# Patient Record
Sex: Female | Born: 1965
Health system: Southern US, Community
[De-identification: ages and names within clinical notes are randomized; demographics above are authoritative.]

## PROBLEM LIST (undated history)

## (undated) DIAGNOSIS — R519 Headache, unspecified: Secondary | ICD-10-CM

## (undated) DIAGNOSIS — R51 Headache: Secondary | ICD-10-CM

## (undated) DIAGNOSIS — F419 Anxiety disorder, unspecified: Secondary | ICD-10-CM

## (undated) DIAGNOSIS — F418 Other specified anxiety disorders: Secondary | ICD-10-CM

## (undated) HISTORY — PX: WISDOM TOOTH EXTRACTION: SHX21

## (undated) HISTORY — DX: Headache: R51

## (undated) HISTORY — PX: TUBAL LIGATION: SHX77

## (undated) HISTORY — DX: Anxiety disorder, unspecified: F41.9

## (undated) HISTORY — DX: Headache, unspecified: R51.9

## (undated) HISTORY — DX: Other specified anxiety disorders: F41.8

## (undated) HISTORY — PX: TONSILLECTOMY: SHX5217

---

## 1998-02-19 ENCOUNTER — Other Ambulatory Visit: Admission: RE | Admit: 1998-02-19 | Discharge: 1998-02-19 | Payer: Self-pay | Admitting: Obstetrics and Gynecology

## 1999-06-29 ENCOUNTER — Other Ambulatory Visit: Admission: RE | Admit: 1999-06-29 | Discharge: 1999-06-29 | Payer: Self-pay | Admitting: Obstetrics and Gynecology

## 2000-07-14 ENCOUNTER — Other Ambulatory Visit: Admission: RE | Admit: 2000-07-14 | Discharge: 2000-07-14 | Payer: Self-pay | Admitting: Obstetrics and Gynecology

## 2001-09-19 ENCOUNTER — Other Ambulatory Visit: Admission: RE | Admit: 2001-09-19 | Discharge: 2001-09-19 | Payer: Self-pay | Admitting: Obstetrics and Gynecology

## 2002-12-04 ENCOUNTER — Other Ambulatory Visit: Admission: RE | Admit: 2002-12-04 | Discharge: 2002-12-04 | Payer: Self-pay | Admitting: Obstetrics and Gynecology

## 2003-12-25 ENCOUNTER — Other Ambulatory Visit: Admission: RE | Admit: 2003-12-25 | Discharge: 2003-12-25 | Payer: Self-pay | Admitting: Obstetrics and Gynecology

## 2005-01-12 ENCOUNTER — Emergency Department (HOSPITAL_COMMUNITY): Admission: EM | Admit: 2005-01-12 | Discharge: 2005-01-13 | Payer: Self-pay | Admitting: Emergency Medicine

## 2005-01-14 ENCOUNTER — Ambulatory Visit: Payer: Self-pay | Admitting: Otolaryngology

## 2005-01-19 ENCOUNTER — Other Ambulatory Visit: Admission: RE | Admit: 2005-01-19 | Discharge: 2005-01-19 | Payer: Self-pay | Admitting: Obstetrics and Gynecology

## 2007-06-22 ENCOUNTER — Ambulatory Visit (HOSPITAL_COMMUNITY): Admission: RE | Admit: 2007-06-22 | Discharge: 2007-06-22 | Payer: Self-pay | Admitting: Obstetrics and Gynecology

## 2010-09-13 ENCOUNTER — Encounter: Admission: RE | Admit: 2010-09-13 | Discharge: 2010-09-13 | Payer: Self-pay | Admitting: Obstetrics and Gynecology

## 2010-10-31 ENCOUNTER — Encounter: Payer: Self-pay | Admitting: Obstetrics and Gynecology

## 2011-02-22 ENCOUNTER — Other Ambulatory Visit: Payer: Self-pay | Admitting: Obstetrics and Gynecology

## 2011-02-22 DIAGNOSIS — R1907 Generalized intra-abdominal and pelvic swelling, mass and lump: Secondary | ICD-10-CM

## 2011-02-22 NOTE — Op Note (Signed)
NAME:  Christina [name], GAMMON                 ACCOUNT NO.:  [id number]   MEDICAL RECORD NO.:  [id number]          PATIENT TYPE:  AMB   LOCATION:  [facility]C                           FACILITY:  [facility]   PHYSICIAN:  Juluis Mire, M.D.   DATE OF BIRTH:  07/18/1966   DATE OF PROCEDURE:  06/22/2007  DATE OF DISCHARGE:                               OPERATIVE REPORT   PREOPERATIVE DIAGNOSIS:  Multiparity, desires sterility.   POSTOPERATIVE DIAGNOSIS:  Multiparity, desires sterility.   OPERATIVE PROCEDURE:  Laparoscopic bilateral tubal fulguration.   SURGEON:  Juluis Mire, M.D.   ANESTHESIA:  General.   ESTIMATED BLOOD LOSS:  Minimal.   PACKS AND DRAINS:  None.   INTRAOPERATIVE BLOOD REPLACED:  None.   COMPLICATIONS:  None.   INDICATIONS:  Noted in the history and physical.   PROCEDURE:  The patient was take to the OR and placed in supine  position.  After a satisfactory level of general endotracheal anesthesia  was obtained, the patient was placed in dorsal supine position using the  Allen stirrups.  The abdomen, perineum and vagina were prepped out with  Betadine.  The bladder was emptied by in-and-out catheterization.  A  Hulka tenaculum was put in place and secured.  The patient then draped  in a sterile field.  A subumbilical incision made with a knife.  The  Veress needle was introduced into the abdominal cavity.  The abdomen was  insufflated with approximately 3 L of carbon dioxide.  The operating  laparoscope was introduced.  Visualization revealed no evidence of  injury to adjacent organs.  The upper abdomen including the liver and  tip of the gallbladder were clear.  The appendix was hard to visualize  and may been retrocecal, but there were no abnormalities in the medial  or lateral gutter.  Tubes and ovaries and uterus were unremarkable, no  pelvic pathology.  Using the bipolar, a 3-cm segment tube was coagulated  until the resistance read 0.  Then the same segment tube was then  recoagulated, completely desiccating the tube.  Coagulation did extend  out to the mesosalpinx.  At the end of the procedure both tubes were  adequately coagulated.  The abdomen was deflated of its carbon dioxide.  All trocars were removed.  The subumbilical incision was closed with  interrupted subcuticulars of 4-0 Vicryl.  The Hulka  tenaculum then removed.  The patient taken out of the dorsal lithotomy  position, once alert and extubated transferred to the recovery room in  good condition.  Sponge, instrument and needle count was reported as  correct by the circulating nurse x2.      Juluis Mire, M.D.  Electronically Signed     JSM/MEDQ  D:  06/22/2007  T:  06/22/2007  Job:  [id number]

## 2011-02-22 NOTE — H&P (Signed)
NAME:  Christina [name], Christina [name] NO.:  [id number]   MEDICAL RECORD NO.:  [id number]          PATIENT TYPE:  AMB   LOCATION:  SDC                           [facility]ACILITY:  [facility]   PHYSICIAN:  Juluis Mire, M.D.   DATE OF BIRTH:  21-Dec-1965   DATE OF ADMISSION:  06/22/2007  DATE OF DISCHARGE:                              HISTORY & PHYSICAL   The patient is a 45 year old gravida 2, para 2 female who presents for  laparoscopic bilateral tubal fulguration.  Alternative forms of birth  control have been discussed.  The potential irreversibility of  sterilization was explained.  Failure rate of 1:200 was quoted.  Failures can be in the form of ectopic pregnancy requiring further  surgical management.   IN TERMS OF ALLERGIES, ALLERGIC TO ERYTHROMYCIN.   MEDICATIONS:  Are none.   PAST MEDICAL HISTORY:  Usual childhood diseases.  No significant  sequelae.   OBSTETRICAL HISTORY:  Two vaginal deliveries.   FAMILY HISTORY:  Noncontributory.   SOCIAL HISTORY:  Reveals no tobacco, alcohol use.   REVIEW OF SYSTEMS:  Noncontributory.   PHYSICAL EXAMINATION:  The patient is afebrile with stable vital signs.  HEENT EXAM:  The patient is normocephalic.  Pupils equal and react to  light accommodation.  Extraocular movements intact.  Sclerae and  conjunctivae clear.  Oropharynx clear.  NECK:  Without thyromegaly.  BREASTS:  No discrete masses.  LUNGS:  Clear.  CARDIOVASCULAR SYSTEM:  Regular rhythm and rate without murmurs or  gallops.  Her abdominal exam is benign.  No mass, megaly or tenderness.  On pelvic, normal external genitalia.  Vaginal mucosa is clear.  Cervix  unremarkable.  Uterus normal size, shape and contour.  Adnexa free of  masses or tenderness.  EXTREMITIES:  Trace edema.  NEUROLOGIC EXAM: :  Grossly within normal limits.   IMPRESSION:  Multiparity, desires sterility.   PLAN:  The patient to undergo diagnostic laparoscopy with laparoscopic  bilateral tubal  fulguration.  The risks of surgery have been discussed  including the risk of infection.  Risk of hemorrhage that could require  transfusion with  the risk of AIDS or hepatitis.  The risk of injury to adjacent organs  including bladder, bowel, and ureters that could require further  exploratory surgery.  Risk of deep venous thrombosis and pulmonary  emboli.  The patient expressed understanding of indications and risks.      Juluis Mire, M.D.  Electronically Signed     JSM/MEDQ  D:  06/22/2007  T:  06/22/2007  Job:  [id number]

## 2011-02-22 NOTE — H&P (Signed)
NAME:  Christina [name], DRUM NO.:  [id number]   MEDICAL RECORD NO.:  [id number]          PATIENT TYPE:  AMB   LOCATION:  SDC                           [facility]ILITY:  [facility]   PHYSICIAN:  Juluis Mire, M.D.   DATE OF BIRTH:  08/05/1966   DATE OF ADMISSION:  06/22/2007  DATE OF DISCHARGE:                              HISTORY & PHYSICAL   HISTORY AND PHYSICAL:  Patient is a 45 year old gravida 2, para 2 female  who presents for permanent sterilization.  Alternative forms of birth  control were discussed.  The potential irreversibility of sterilization  is explained.  The failure rate of 1 in 200 to 1 in 300 was discussed.  Failures can be in the form of ectopic pregnancy requiring further  surgical management.  Patient is desirous of permanent sterilization.   In terms of allergies, patient has allergies to erythromycin.   MEDICATIONS:  None.   PAST MEDICAL HISTORY:  Usual childhood diseases.  No significant  sequelae.  No past surgical history.   OBSTETRICAL HISTORY:  She has had two vaginal deliveries.   FAMILY HISTORY:  Noncontributory.   SOCIAL HISTORY:  No tobacco or alcohol use.   REVIEW OF SYSTEMS:  Noncontributory.   PHYSICAL EXAMINATION:  VITAL SIGNS:  Patient is afebrile with stable  vital signs.  HEENT:  Patient normocephalic.  Pupils equal, round, reactive to light  and accommodation.  Extraocular movements are intact.  Sclerae and  conjunctivae are clear.  Oropharynx is clear.  NECK:  Without thyromegaly.  BREASTS:  No discrete masses.  LUNGS:  Clear.  CARDIOVASCULAR:  Regular in rate without murmurs or gallops.  ABDOMEN:  Benign.   DICTATION ENDED AT THIS POINT      Juluis Mire, M.D.  Electronically Signed     JSM/MEDQ  D:  06/22/2007  T:  06/22/2007  Job:  [id number]

## 2011-02-23 ENCOUNTER — Other Ambulatory Visit: Payer: Self-pay

## 2011-02-25 ENCOUNTER — Other Ambulatory Visit: Payer: Self-pay

## 2011-02-25 MED ORDER — GADOBENATE DIMEGLUMINE 529 MG/ML IV SOLN: 10.0000 mL | Freq: Once | INTRAVENOUS | Status: AC | PRN

## 2011-02-26 ENCOUNTER — Ambulatory Visit
Admission: RE | Admit: 2011-02-26 | Discharge: 2011-02-26 | Disposition: A | Discharge status: Home / Self Care | Payer: Medicare HMO | Source: Ambulatory Visit | Attending: Obstetrics and Gynecology | Admitting: Obstetrics and Gynecology

## 2011-02-26 DIAGNOSIS — R1907 Generalized intra-abdominal and pelvic swelling, mass and lump: Secondary | ICD-10-CM

## 2011-02-26 MED ORDER — GADOBENATE DIMEGLUMINE 529 MG/ML IV SOLN
10.0000 mL | Freq: Once | INTRAVENOUS | Status: AC | PRN
  Administered 2011-02-26: 10 mL via INTRAVENOUS

## 2011-07-22 LAB — CBC
Platelets: 262
RDW: 12.5
WBC: 12.5 — ABNORMAL HIGH

## 2012-02-15 ENCOUNTER — Other Ambulatory Visit (HOSPITAL_COMMUNITY): Payer: Self-pay | Admitting: Obstetrics and Gynecology

## 2012-02-15 DIAGNOSIS — D72829 Elevated white blood cell count, unspecified: Secondary | ICD-10-CM

## 2012-02-16 ENCOUNTER — Encounter (HOSPITAL_COMMUNITY): Payer: Self-pay | Admitting: *Deleted

## 2012-02-16 ENCOUNTER — Encounter (HOSPITAL_COMMUNITY): Payer: Self-pay | Admitting: Anesthesiology

## 2012-02-16 ENCOUNTER — Telehealth (INDEPENDENT_AMBULATORY_CARE_PROVIDER_SITE_OTHER): Payer: Self-pay | Admitting: General Surgery

## 2012-02-16 ENCOUNTER — Inpatient Hospital Stay (HOSPITAL_COMMUNITY): Payer: Managed Care, Other (non HMO) | Admitting: Anesthesiology

## 2012-02-16 ENCOUNTER — Observation Stay (HOSPITAL_COMMUNITY)
Admission: EM | Admit: 2012-02-16 | Discharge: 2012-02-17 | Disposition: A | Discharge status: Home / Self Care | Payer: Managed Care, Other (non HMO) | Attending: General Surgery | Admitting: General Surgery

## 2012-02-16 ENCOUNTER — Ambulatory Visit (HOSPITAL_COMMUNITY)
Admission: RE | Admit: 2012-02-16 | Discharge: 2012-02-16 | Disposition: A | Discharge status: Home / Self Care | Payer: Managed Care, Other (non HMO) | Source: Ambulatory Visit | Attending: Obstetrics and Gynecology | Admitting: Obstetrics and Gynecology

## 2012-02-16 ENCOUNTER — Encounter (HOSPITAL_COMMUNITY): Admission: EM | Disposition: A | Discharge status: Home / Self Care | Payer: Self-pay | Source: Home / Self Care

## 2012-02-16 DIAGNOSIS — K3580 Unspecified acute appendicitis: Secondary | ICD-10-CM

## 2012-02-16 DIAGNOSIS — D72829 Elevated white blood cell count, unspecified: Secondary | ICD-10-CM | POA: Insufficient documentation

## 2012-02-16 DIAGNOSIS — Z01812 Encounter for preprocedural laboratory examination: Secondary | ICD-10-CM | POA: Insufficient documentation

## 2012-02-16 DIAGNOSIS — K37 Unspecified appendicitis: Principal | ICD-10-CM | POA: Insufficient documentation

## 2012-02-16 DIAGNOSIS — R1031 Right lower quadrant pain: Secondary | ICD-10-CM

## 2012-02-16 HISTORY — PX: LAPAROSCOPIC APPENDECTOMY: SHX408

## 2012-02-16 LAB — CBC
HCT: 41.4 % (ref 36.0–46.0)
MCHC: 33.8 g/dL (ref 30.0–36.0)
Platelets: 275 10*3/uL (ref 150–400)
RDW: 12.4 % (ref 11.5–15.5)
WBC: 8.5 10*3/uL (ref 4.0–10.5)

## 2012-02-16 LAB — BASIC METABOLIC PANEL
Chloride: 102 mEq/L (ref 96–112)
GFR calc Af Amer: >90 mL/min (ref >90)
GFR calc non Af Amer: >90 mL/min (ref >90)
Potassium: 3.9 mEq/L (ref 3.5–5.1)
Sodium: 136 mEq/L (ref 135–145)

## 2012-02-16 LAB — PREGNANCY, URINE: Preg Test, Ur: NEGATIVE

## 2012-02-16 LAB — URINALYSIS, ROUTINE W REFLEX MICROSCOPIC
Hgb urine dipstick: NEGATIVE
Nitrite: NEGATIVE
Protein, ur: NEGATIVE mg/dL
Urobilinogen, UA: 0.2 mg/dL (ref 0.0–1.0)

## 2012-02-16 LAB — PROTIME-INR
INR: 1.08 (ref 0.00–1.49)
Prothrombin Time: 14.2 seconds (ref 11.6–15.2)

## 2012-02-16 LAB — APTT: aPTT: 37 seconds (ref 24–37)

## 2012-02-16 SURGERY — APPENDECTOMY, LAPAROSCOPIC
Anesthesia: General | Site: Abdomen | Wound class: Clean Contaminated

## 2012-02-16 MED ORDER — IBUPROFEN 200 MG PO TABS: ORAL_TABLET | ORAL | Status: DC

## 2012-02-16 MED ORDER — HYDROCODONE-ACETAMINOPHEN 5-325 MG PO TABS: 1.0000 | ORAL_TABLET | ORAL | Status: DC | PRN

## 2012-02-16 MED ORDER — BUPIVACAINE HCL (PF) 0.5 % IJ SOLN
INTRAMUSCULAR | Status: AC
  Filled 2012-02-16: qty 30

## 2012-02-16 MED ORDER — DEXAMETHASONE SODIUM PHOSPHATE 10 MG/ML IJ SOLN
INTRAMUSCULAR | Status: DC | PRN
  Administered 2012-02-16: 6 mg via INTRAVENOUS

## 2012-02-16 MED ORDER — HYDROCODONE-ACETAMINOPHEN 5-325 MG PO TABS: 1.0000 | ORAL_TABLET | ORAL | Status: AC | PRN

## 2012-02-16 MED ORDER — LACTATED RINGERS IV SOLN: INTRAVENOUS | Status: DC

## 2012-02-16 MED ORDER — KCL IN DEXTROSE-NACL 20-5-0.45 MEQ/L-%-% IV SOLN
INTRAVENOUS | Status: DC
  Filled 2012-02-16: qty 1000

## 2012-02-16 MED ORDER — MIDAZOLAM HCL 5 MG/5ML IJ SOLN
INTRAMUSCULAR | Status: DC | PRN
  Administered 2012-02-16: 2 mg via INTRAVENOUS

## 2012-02-16 MED ORDER — LACTATED RINGERS IV SOLN
INTRAVENOUS | Status: DC | PRN
  Administered 2012-02-16: 14:00:00 via INTRAVENOUS

## 2012-02-16 MED ORDER — ENOXAPARIN SODIUM 40 MG/0.4ML ~~LOC~~ SOLN
40.0000 mg | SUBCUTANEOUS | Status: DC
  Filled 2012-02-16: qty 0.4

## 2012-02-16 MED ORDER — MORPHINE SULFATE 2 MG/ML IJ SOLN: 1.0000 mg | INTRAMUSCULAR | Status: DC | PRN

## 2012-02-16 MED ORDER — LACTATED RINGERS IR SOLN
Status: DC | PRN
  Administered 2012-02-16: 1000 mL

## 2012-02-16 MED ORDER — DIPHENHYDRAMINE HCL 50 MG/ML IJ SOLN: 12.5000 mg | Freq: Four times a day (QID) | INTRAMUSCULAR | Status: DC | PRN

## 2012-02-16 MED ORDER — FENTANYL CITRATE 0.05 MG/ML IJ SOLN
25.0000 ug | INTRAMUSCULAR | Status: DC | PRN
  Administered 2012-02-16: 25 ug via INTRAVENOUS

## 2012-02-16 MED ORDER — HYDROMORPHONE HCL PF 1 MG/ML IJ SOLN: 1.0000 mg | INTRAMUSCULAR | Status: DC | PRN

## 2012-02-16 MED ORDER — PIPERACILLIN-TAZOBACTAM 3.375 G IVPB
3.3750 g | Freq: Three times a day (TID) | INTRAVENOUS | Status: DC
  Administered 2012-02-16 (×2): 3.375 g via INTRAVENOUS
  Filled 2012-02-16 (×2): qty 50

## 2012-02-16 MED ORDER — DIPHENHYDRAMINE HCL 12.5 MG/5ML PO ELIX: 12.5000 mg | ORAL_SOLUTION | Freq: Four times a day (QID) | ORAL | Status: DC | PRN

## 2012-02-16 MED ORDER — SODIUM CHLORIDE 0.9 % IV SOLN
INTRAVENOUS | Status: DC
  Administered 2012-02-16: 12:00:00 via INTRAVENOUS

## 2012-02-16 MED ORDER — ACETAMINOPHEN 325 MG PO TABS: 650.0000 mg | ORAL_TABLET | Freq: Four times a day (QID) | ORAL | Status: AC | PRN

## 2012-02-16 MED ORDER — ONDANSETRON HCL 4 MG/2ML IJ SOLN
INTRAMUSCULAR | Status: DC | PRN
  Administered 2012-02-16: 4 mg via INTRAVENOUS

## 2012-02-16 MED ORDER — BUPIVACAINE HCL (PF) 0.5 % IJ SOLN
INTRAMUSCULAR | Status: DC | PRN
  Administered 2012-02-16: 20 mL

## 2012-02-16 MED ORDER — IOHEXOL 300 MG/ML  SOLN
100.0000 mL | Freq: Once | INTRAMUSCULAR | Status: AC | PRN
  Administered 2012-02-16: 100 mL via INTRAVENOUS

## 2012-02-16 MED ORDER — SODIUM CHLORIDE 0.9 % IV SOLN
INTRAVENOUS | Status: DC
  Administered 2012-02-16: 16:00:00 via INTRAVENOUS
  Administered 2012-02-17: 100 mL via INTRAVENOUS

## 2012-02-16 MED ORDER — FENTANYL CITRATE 0.05 MG/ML IJ SOLN
INTRAMUSCULAR | Status: DC | PRN
  Administered 2012-02-16: 100 ug via INTRAVENOUS
  Administered 2012-02-16: 50 ug via INTRAVENOUS

## 2012-02-16 MED ORDER — SUCCINYLCHOLINE CHLORIDE 20 MG/ML IJ SOLN
INTRAMUSCULAR | Status: DC | PRN
  Administered 2012-02-16: 80 mg via INTRAVENOUS

## 2012-02-16 MED ORDER — HYDROCODONE-ACETAMINOPHEN 5-325 MG PO TABS
1.0000 | ORAL_TABLET | ORAL | Status: DC | PRN
  Administered 2012-02-16 – 2012-02-17 (×2): 1 via ORAL
  Filled 2012-02-16 (×3): qty 1

## 2012-02-16 MED ORDER — FENTANYL CITRATE 0.05 MG/ML IJ SOLN
INTRAMUSCULAR | Status: AC
  Filled 2012-02-16: qty 2

## 2012-02-16 MED ORDER — ONDANSETRON HCL 4 MG/2ML IJ SOLN: 4.0000 mg | Freq: Four times a day (QID) | INTRAMUSCULAR | Status: DC | PRN

## 2012-02-16 MED ORDER — KETOROLAC TROMETHAMINE 30 MG/ML IJ SOLN
INTRAMUSCULAR | Status: DC | PRN
  Administered 2012-02-16: 30 mg via INTRAVENOUS

## 2012-02-16 SURGICAL SUPPLY — 40 items
ADH SKN CLS APL DERMABOND .7 (GAUZE/BANDAGES/DRESSINGS) ×1
APL SKNCLS STERI-STRIP NONHPOA (GAUZE/BANDAGES/DRESSINGS) ×1
APPLIER CLIP 5 13 M/L LIGAMAX5 (MISCELLANEOUS)
APR CLP MED LRG 5 ANG JAW (MISCELLANEOUS)
BAG SPEC RTRVL LRG 6X4 10 (ENDOMECHANICALS) ×1
BANDAGE ADHESIVE 1X3 (GAUZE/BANDAGES/DRESSINGS) ×6 IMPLANT
BENZOIN TINCTURE PRP APPL 2/3 (GAUZE/BANDAGES/DRESSINGS) ×2 IMPLANT
CABLE HIGH FREQUENCY MONO STRZ (ELECTRODE) ×1 IMPLANT
CANISTER SUCTION 2500CC (MISCELLANEOUS) ×2 IMPLANT
CHLORAPREP W/TINT 26ML (MISCELLANEOUS) ×2 IMPLANT
CLIP APPLIE 5 13 M/L LIGAMAX5 (MISCELLANEOUS) IMPLANT
CLOTH BEACON ORANGE TIMEOUT ST (SAFETY) ×2 IMPLANT
CUTTER FLEX LINEAR 45M (STAPLE) ×1 IMPLANT
DECANTER SPIKE VIAL GLASS SM (MISCELLANEOUS) ×2 IMPLANT
DERMABOND ADVANCED (GAUZE/BANDAGES/DRESSINGS) ×1
DERMABOND ADVANCED .7 DNX12 (GAUZE/BANDAGES/DRESSINGS) IMPLANT
DRAPE LAPAROSCOPIC ABDOMINAL (DRAPES) ×2 IMPLANT
ELECT REM PT RETURN 9FT ADLT (ELECTROSURGICAL) ×2
ELECTRODE REM PT RTRN 9FT ADLT (ELECTROSURGICAL) ×1 IMPLANT
GLOVE SURG SIGNA 7.5 PF LTX (GLOVE) ×4 IMPLANT
GOWN STRL NON-REIN LRG LVL3 (GOWN DISPOSABLE) ×2 IMPLANT
GOWN STRL REIN XL XLG (GOWN DISPOSABLE) ×4 IMPLANT
KIT BASIN OR (CUSTOM PROCEDURE TRAY) ×2 IMPLANT
POUCH SPECIMEN RETRIEVAL 10MM (ENDOMECHANICALS) ×1 IMPLANT
RELOAD 45 VASCULAR/THIN (ENDOMECHANICALS) IMPLANT
RELOAD STAPLE 45 2.5 WHT GRN (ENDOMECHANICALS) IMPLANT
RELOAD STAPLE 45 3.5 BLU ETS (ENDOMECHANICALS) IMPLANT
RELOAD STAPLE TA45 3.5 REG BLU (ENDOMECHANICALS) ×2 IMPLANT
SCALPEL HARMONIC ACE (MISCELLANEOUS) ×1 IMPLANT
SET IRRIG TUBING LAPAROSCOPIC (IRRIGATION / IRRIGATOR) ×2 IMPLANT
SOLUTION ANTI FOG 6CC (MISCELLANEOUS) ×2 IMPLANT
STRIP CLOSURE SKIN 1/2X4 (GAUZE/BANDAGES/DRESSINGS) ×2 IMPLANT
SUT MNCRL AB 4-0 PS2 18 (SUTURE) ×2 IMPLANT
TOWEL OR 17X26 10 PK STRL BLUE (TOWEL DISPOSABLE) ×3 IMPLANT
TOWEL OR NON WOVEN STRL DISP B (DISPOSABLE) ×1 IMPLANT
TRAY FOLEY CATH 14FRSI W/METER (CATHETERS) ×2 IMPLANT
TRAY LAP CHOLE (CUSTOM PROCEDURE TRAY) ×2 IMPLANT
TROCAR BLADELESS OPT 5 75 (ENDOMECHANICALS) ×4 IMPLANT
TROCAR XCEL BLUNT TIP 100MML (ENDOMECHANICALS) IMPLANT
TUBING INSUFFLATION 10FT LAP (TUBING) ×2 IMPLANT

## 2012-02-16 NOTE — Anesthesia Preprocedure Evaluation (Signed)
Anesthesia Evaluation  Patient identified by MRN, date of birth, ID band Patient awake    Reviewed: Allergy & Precautions, H&P , NPO status , Patient's Chart, lab work & pertinent test results  Airway Mallampati: II TM Distance: >3 FB Neck ROM: full    Dental No notable dental hx. (+) Teeth Intact and Dental Advisory Given   Pulmonary neg pulmonary ROS,  breath sounds clear to auscultation  Pulmonary exam normal       Cardiovascular Exercise Tolerance: Good negative cardio ROS  Rhythm:regular Rate:Normal     Neuro/Psych negative neurological ROS  negative psych ROS   GI/Hepatic negative GI ROS, Neg liver ROS,   Endo/Other  negative endocrine ROS  Renal/GU negative Renal ROS  negative genitourinary   Musculoskeletal   Abdominal   Peds  Hematology negative hematology ROS (+)   Anesthesia Other Findings   Reproductive/Obstetrics negative OB ROS                           Anesthesia Physical Anesthesia Plan  ASA: I and Emergent  Anesthesia Plan: General   Post-op Pain Management:    Induction: Intravenous  Airway Management Planned: Oral ETT  Additional Equipment:   Intra-op Plan:   Post-operative Plan: Extubation in OR  Informed Consent: I have reviewed the patients History and Physical, chart, labs and discussed the procedure including the risks, benefits and alternatives for the proposed anesthesia with the patient or authorized representative who has indicated his/her understanding and acceptance.   Dental Advisory Given  Plan Discussed with: CRNA and Surgeon  Anesthesia Plan Comments:         Anesthesia Quick Evaluation

## 2012-02-16 NOTE — Transfer of Care (Signed)
Immediate Anesthesia Transfer of Care Note  Patient: Christina [name]  Procedure(s) Performed: Procedure(s) (LRB): APPENDECTOMY LAPAROSCOPIC (N/A)  Patient Location: PACU  Anesthesia Type: General  Level of Consciousness: awake, alert  and oriented  Airway & Oxygen Therapy: Patient Spontanous Breathing and Patient connected to face mask oxygen  Post-op Assessment: Report given to PACU RN and Post -op Vital signs reviewed and stable  Post vital signs: Reviewed and stable  Complications: No apparent anesthesia complications2

## 2012-02-16 NOTE — Anesthesia Postprocedure Evaluation (Signed)
  Anesthesia Post-op Note  Patient: Christina [name]  Procedure(s) Performed: Procedure(s) (LRB): APPENDECTOMY LAPAROSCOPIC (N/A)  Patient Location: PACU  Anesthesia Type: General  Level of Consciousness: awake and alert   Airway and Oxygen Therapy: Patient Spontanous Breathing  Post-op Pain: mild  Post-op Assessment: Post-op Vital signs reviewed, Patient's Cardiovascular Status Stable, Respiratory Function Stable, Patent Airway and No signs of Nausea or vomiting  Post-op Vital Signs: stable  Complications: No apparent anesthesia complications

## 2012-02-16 NOTE — H&P (Signed)
Referring Physician: Keagan Anthis is an 46 y.o. female.  HPI: Third and abdominal pain 2 days ago. She describes as long her abdomen below the umbilicus, and the right lower quadrant. She had a similar episode last year it turned out to be an ovarian cyst. She actually had some syncope with the discomfort from that. She went to Dr. Cletis Media office yesterday and a CT was obtained. This showed a dilated appendix up to 10 mm with slight inflammatory stranding around the appendix. There was also some prominent right lower quadrant mesenteric lymph nodes. This was read as appendicitis and patient was transferred for [facility] to [facility] for evaluation and treatment. Labs and urinalysis have been requested by the ER physician. We've been asked to see the patient.  History reviewed. No pertinent past medical history.    Past Surgical History    Procedure  Date    .  Tubal ligation      No family history on file.  Social History: reports that she has never smoked. She does not have any smokeless tobacco history on file. She reports that she does not drink alcohol or use illicit drugs.  Allergies: No Known Allergies  Medications:  Prior to Admission:  (Not in a hospital admission)  Scheduled:  Continuous:      .  sodium chloride  125 mL/hr at 02/16/12 1132     PRN:  No results found for this or any previous visit (from the past 48 hour(s)).  Ct Abdomen Pelvis W Contrast  02/16/2012 *RADIOLOGY REPORT* Clinical Data: Lower abdominal pain, especially right lower quadrant. Elevated white blood cell count. CT ABDOMEN AND PELVIS WITH CONTRAST Technique: Multidetector CT imaging of the abdomen and pelvis was performed following the standard protocol during bolus administration of intravenous contrast. Contrast: OMNIPAQUE IOHEXOL 300 MG/ML SOLN Comparison: MRI of the pelvis 02/25/2011. Findings: Lung bases are clear. No effusions. Heart is normal size. Liver, gallbladder,  spleen, pancreas, adrenals and left kidney unremarkable. Small cysts in the right kidney. No hydronephrosis. Normal and symmetric enhancement kidneys bilaterally. The appendix is dilated, measuring up to 10 mm. Slight inflammatory stranding around the appendix. Mildly prominent right lower quadrant mesenteric lymph nodes. Findings compatible with early acute appendicitis. Uterus, adnexa and urinary bladder are unremarkable. Large and small bowel grossly unremarkable. No free fluid, free air or adenopathy. No acute bony abnormality. IMPRESSION: Findings compatible with early acute appendicitis. These results were called to Julio Sicks, nurse practitioner at the time of interpretation. Original Report Authenticated By: Cyndie Chime, M.D.   Review of Systems  Constitutional: Positive for fever and chills. Negative for weight loss, malaise/fatigue and diaphoresis.  HENT: Negative.  Eyes: Negative.  Respiratory: Negative.  Cardiovascular: Negative.  Gastrointestinal: Positive for nausea and abdominal pain. Negative for heartburn, vomiting, diarrhea, constipation, blood in stool and melena.  Genitourinary: Negative.  Musculoskeletal: Negative.  Skin: Negative.  Neurological: Negative. Negative for weakness.  Endo/Heme/Allergies: Negative.  Psychiatric/Behavioral: Negative.   Blood pressure 125/71, pulse 70, temperature 98.1 F (36.7 C), temperature source Oral, resp. rate 18, last menstrual period 01/27/2012, SpO2 100.00%.  Physical Exam  Constitutional: She is oriented to person, place, and time. She appears well-developed and well-nourished. No distress.  HENT:  Head: Normocephalic and atraumatic.  Nose: Nose normal.  Mouth/Throat: No oropharyngeal exudate.  Eyes: Conjunctivae and EOM are normal. Pupils are equal, round, and reactive to light. Right eye exhibits no discharge. Left eye exhibits no discharge. No scleral icterus.  Neck: Normal range of motion. Neck supple. No JVD present. No  tracheal deviation present. No thyromegaly present.  Cardiovascular: Normal rate, regular rhythm, normal heart sounds and intact distal pulses. Exam reveals no gallop and no friction rub.  No murmur heard.  Respiratory: Effort normal and breath sounds normal. No stridor. No respiratory distress. She has no wheezes. She has no rales. She exhibits no tenderness.  GI: Soft. Bowel sounds are normal. She exhibits no distension and no mass. There is no tenderness. There is no rebound and no guarding.  Musculoskeletal: She exhibits no edema and no tenderness.  Lymphadenopathy:  She has no cervical adenopathy.  Neurological: She is alert and oriented to person, place, and time. She has normal reflexes. No cranial nerve deficit.  Skin: Skin is warm and dry. No rash noted. She is not diaphoretic. No erythema. No pallor.  Psychiatric: She has a normal mood and affect. Her behavior is normal. Judgment and thought content normal.   Assessment/Plan:  Impression:  1. Acute appendicitis  2. History of laparoscopic bilateral tubal fulgration  Plan: We will admit the patient start her on antibiotic and scheduled for surgery later today. Labs are pending. [facility] physician assistant for Dr. Abigail Miyamoto.  Takhia Spoon  02/16/2012, 11:40 AM

## 2012-02-16 NOTE — Discharge Instructions (Signed)
[facility] ______[facility] LAPAROSCOPIC SURGERY: POST OP INSTRUCTIONS Always review your discharge instruction sheet given to you by the facility where your surgery was performed. IF YOU HAVE DISABILITY OR FAMILY LEAVE FORMS, YOU MUST BRING THEM TO THE OFFICE FOR PROCESSING.   DO NOT GIVE THEM TO YOUR DOCTOR.  1. A prescription for pain medication may be given to you upon discharge.  Take your pain medication as prescribed, if needed.  If narcotic pain medicine is not needed, then you may take acetaminophen (Tylenol) or ibuprofen (Advil) as needed. 2. Take your usually prescribed medications unless otherwise directed. 3. If you need a refill on your pain medication, please contact your pharmacy.  They will contact our office to request authorization. Prescriptions will not be filled after 5pm or on week-ends. 4. You should follow a light diet the first few days after arrival home, such as soup and crackers, etc.  Be sure to include lots of fluids daily. 5. Most patients will experience some swelling and bruising in the area of the incisions.  Ice packs will help.  Swelling and bruising can take several days to resolve.  6. It is common to experience some constipation if taking pain medication after surgery.  Increasing fluid intake and taking a stool softener (such as Colace) will usually help or prevent this problem from occurring.  A mild laxative (Milk of Magnesia or Miralax) should be taken according to package instructions if there are no bowel movements after 48 hours. 7. Unless discharge instructions indicate otherwise, you may remove your bandages 24-48 hours after surgery, and you may shower at that time.  You may have steri-strips (small skin tapes) in place directly over the incision.  These strips should be left on the skin for 7-10 days.  If your surgeon used skin glue on the incision, you may shower in 24 hours.  The glue will flake off over the next 2-3 weeks.  Any sutures or  staples will be removed at the office during your follow-up visit. 8. ACTIVITIES:  You may resume regular (light) daily activities beginning the next day--such as daily self-care, walking, climbing stairs--gradually increasing activities as tolerated.  You may have sexual intercourse when it is comfortable.  Refrain from any heavy lifting or straining until approved by your doctor. a. You may drive when you are no longer taking prescription pain medication, you can comfortably wear a seatbelt, and you can safely maneuver your car and apply brakes. b. RETURN TO WORK:  __________________________________________________________ 9. You should see your doctor in the office for a follow-up appointment approximately 2-3 weeks after your surgery.  Make sure that you call for this appointment within a day or two after you arrive home to insure a convenient appointment time. 10. OTHER INSTRUCTIONS: __________________________________________________________________________________________________________________________ __________________________________________________________________________________________________________________________ WHEN TO CALL YOUR DOCTOR: 1. Fever over 101.0 2. Inability to urinate 3. Continued bleeding from incision. 4. Increased pain, redness, or drainage from the incision. 5. Increasing abdominal pain  The clinic staff is available to answer your questions during regular business hours.  Please don't hesitate to call and ask to speak to one of the nurses for clinical concerns.  If you have a medical emergency, go to the nearest emergency room or call 911.  A surgeon from [facility] is always on call at the hospital. [address], [address], [place], [place]  [address] ? [address] [address], [place], [place]   [address] [phone] ? [phone] ? FAX [phone] Web site:   [email or web address] Laparoscopic Appendectomy Appendectomy is surgery to remove  the appendix. Laparoscopic surgery uses several small cuts (incisions) instead of one large incision. Laparoscopic surgery offers a shorter recovery time and less discomfort. LET YOUR CAREGIVER KNOW ABOUT:  Allergies to food or medicine.   Medicines taken, including vitamins, dietary supplements, herbs, eyedrops, over-the-counter medicines, and creams.   Use of steroids (by mouth or creams).   Previous problems with anesthetics or numbing medicines.   History of bleeding problems or blood clots.   Previous surgery.   Other health problems, including diabetes, heart problems, lung problems, and kidney problems.   Possibility of pregnancy, if this applies.  RISKS AND COMPLICATIONS  Infection. A germ starts growing in the wound. This can usually be treated with antibiotics. In some cases, the wound will need to be opened and cleaned.   Bleeding.   Damage to other organs.   Sores (abscesses).   Chronic pain at the incision sites. This is defined as pain that lasts for more than 3 months.   Blood clots in the legs that may rarely travel to the lungs.   Infection in the lungs (pneumonia).  BEFORE THE PROCEDURE Appendectomy is usually performed immediately after an inflamed appendix (appendicitis) is diagnosed. No preparation is necessary ahead of this procedure. PROCEDURE  You will be given medicine that makes you sleep (general anesthetic). After you are asleep, a flexible tube (catheter) may be inserted into your bladder to drain your urine during surgery. The tube is removed before you wake up after surgery. When you are asleep, carbondioxide gas will be used to inflate your abdomen. This will allow your surgeon to see inside your abdomen and perform your surgery. Three small incisions will be made in your abdomen. Your surgeon will insert a thin, lighted tube (laparoscope) through one of the incisions. Your surgeon will look through the laparoscope while performing the surgery.  Other tools will be inserted through the other incisions. Laparoscopic procedures may not be appropriate when:  There is major scarring from a previous surgery.   The patient has bleeding disorders.   A pregnancy is near term.   There are other conditions which make the laparoscopic procedure impossible, such as an advanced infection or a ruptured appendix.  If your surgeon feels it is not safe to continue with the laparoscopic procedure, he or she will perform an open surgery instead. This gives the surgeon a larger view and more space to work. Open surgery requires a longer recovery time. After your appendix is removed, your incisions will be closed with stitches (sutures) or skin adhesive. AFTER THE PROCEDURE You will be taken to a recovery room. When the anesthesia has worn off, you will be returned to your hospital room. You will be given pain medicines to keep you comfortable. Ask your caregiver how long your hospital stay will be. Document Released: 05/10/2004 Document Revised: 09/15/2011 Document Reviewed: 04/05/2011 Exit[employer] Patient Information 2012 [employer]. 

## 2012-02-16 NOTE — Telephone Encounter (Signed)
ANNETTE WITH DR. Lisbeth Ply OFFICE CALLED TO ASK WHAT TO DO WITH PT SENT TO [facility] FOR [facility] WITH APPENDICITIS. I REVIEWED THIS WITH DR. BLACKMAN AND HE SAID FOR PT TO BE SENT TO [facility], NOTIFY ER AND [facility]. I TALKED WITH ANNETTE AND GAVE HER DR. BLACKMAN'S INSTRUCTIONS. ALSO NOTIFIED [facility]/ STACY) AND [facility]/GY

## 2012-02-16 NOTE — ED Notes (Signed)
Pt was seen at [facility] and saw the NP Okey Regal and did a vaginal [place] that ruled out ovarian cysts. Pt had a high white count and was sent for CT.  Pt had a positive appy on CT and was sent here to see MD Simeon Craft.  Pt reports several days of lower right side pain to midline.  Pt denies V/D.  Pt reports nausea.  Pt had low grade fever yesterday.

## 2012-02-16 NOTE — Progress Notes (Signed)
Patient tolerated clear liquids well. No c/o n & v. Voiding. Ambulated in the hall 360 ft.

## 2012-02-16 NOTE — Anesthesia Procedure Notes (Signed)
Procedure Name: Intubation Date/Time: 02/16/2012 1:54 PM Performed by: Kendal Hymen Pre-anesthesia Checklist: Patient identified, Timeout performed, Emergency Drugs available, Suction available and Patient being monitored Patient Re-evaluated:Patient Re-evaluated prior to inductionOxygen Delivery Method: Circle system utilized Preoxygenation: Pre-oxygenation with 100% oxygen Intubation Type: IV induction Ventilation: Mask ventilation without difficulty Laryngoscope Size: Mac and 3 Grade View: Grade I Tube type: Oral Number of attempts: 1 Airway Equipment and Method: Stylet Placement Confirmation: ETT inserted through vocal cords under direct vision,  positive ETCO2 and breath sounds checked- equal and bilateral Secured at: 21 cm Tube secured with: Tape Dental Injury: Teeth and Oropharynx as per pre-operative assessment

## 2012-02-16 NOTE — Consult Note (Signed)
I have seen and examined the patient and agree with the assessment and plans.  Keoshia Steinmetz A. Jackson Coffield  MD, FACS  

## 2012-02-16 NOTE — H&P (Signed)
I have seen and examined the patient and agree with the assessment and plans.  Plan laparoscopic appendectomy. The risks of the surgery were discussed. These risks include but are not limited to bleeding, infection, appendiceal stump leak, injury to other structures, need to convert to an open procedure, ongoing infection, et Karie Soda. She understands and wishes to proceed.  Shamere Dilworth A. Magnus Ivan  MD, FACS

## 2012-02-16 NOTE — Consult Note (Signed)
Referring Physician: [name] is an 46 y.o. female.  HPI: Third and abdominal pain 2 days ago. She describes as long her abdomen below the umbilicus, and the right lower quadrant. She had a similar episode last year it turned out to be an ovarian cyst. She actually had some syncope with the discomfort from that. She went to Dr. Cletis [name] office yesterday and a CT was obtained. This showed a dilated appendix up to 10 mm with slight inflammatory stranding around the appendix. There was also some prominent right lower quadrant mesenteric lymph nodes. This was read as appendicitis and patient was transferred for Christina [name] to Christina [name] for evaluation and treatment. Labs and urinalysis have been requested by the ER physician. We've been asked to see the patient.  History reviewed. No pertinent past medical history.  Past Surgical History  Procedure Date  . Tubal ligation     No family history on file.  Social History:  reports that she has never smoked. She does not have any smokeless tobacco history on file. She reports that she does not drink alcohol or use illicit drugs.  Allergies: No Known Allergies  Medications:  Prior to Admission:  (Not in a [name] admission) Scheduled:  Continuous:   . sodium chloride 125 mL/hr at 02/16/12 1132   PRN:  No results found for this or any previous visit (from the past 48 hour(s)).  Ct Abdomen Pelvis W Contrast  02/16/2012  *RADIOLOGY REPORT*  Clinical Data: Lower abdominal pain, especially right lower quadrant.  Elevated white blood cell count.  CT ABDOMEN AND PELVIS WITH CONTRAST  Technique:  Multidetector CT imaging of the abdomen and pelvis was performed following the standard protocol during bolus administration of intravenous contrast.  Contrast: OMNIPAQUE IOHEXOL 300 MG/ML  SOLN  Comparison: MRI of the pelvis 02/25/2011.  Findings: Lung bases are clear.  No effusions.  Heart is normal size.  Liver,  gallbladder, spleen, pancreas, adrenals and left kidney unremarkable.  Small cysts in the right kidney.  No hydronephrosis. Normal and symmetric enhancement kidneys bilaterally.  The appendix is dilated, measuring up to 10 mm.  Slight inflammatory stranding around the appendix.  Mildly prominent right lower quadrant mesenteric lymph nodes.  Findings compatible with early acute appendicitis.  Uterus, adnexa and urinary bladder are unremarkable.  Large and small bowel grossly unremarkable.  No free fluid, free air or adenopathy.  No acute bony abnormality.  IMPRESSION: Findings compatible with early acute appendicitis.  These results were called to Christina [name], nurse  practitioner at the time of interpretation.  Original Report Authenticated By: Cyndie Chime, M.D.    Review of Systems  Constitutional: Positive for fever and chills. Negative for weight loss, malaise/fatigue and diaphoresis.  HENT: Negative.   Eyes: Negative.   Respiratory: Negative.   Cardiovascular: Negative.   Gastrointestinal: Positive for nausea and abdominal pain. Negative for heartburn, vomiting, diarrhea, constipation, blood in stool and melena.  Genitourinary: Negative.   Musculoskeletal: Negative.   Skin: Negative.   Neurological: Negative.  Negative for weakness.  Endo/Heme/Allergies: Negative.   Psychiatric/Behavioral: Negative.    Blood pressure 125/71, pulse 70, temperature 98.1 F (36.7 C), temperature source Oral, resp. rate 18, last menstrual period 01/27/2012, SpO2 100.00%. Physical Exam  Constitutional: She is oriented to person, place, and time. She appears well-developed and well-nourished. No distress.  HENT:  Head: Normocephalic and atraumatic.  Nose: Nose normal.  Mouth/Throat: No oropharyngeal exudate.  Eyes: Conjunctivae and EOM are normal.  Pupils are equal, round, and reactive to light. Right eye exhibits no discharge. Left eye exhibits no discharge. No scleral icterus.  Neck: Normal range of  motion. Neck supple. No JVD present. No tracheal deviation present. No thyromegaly present.  Cardiovascular: Normal rate, regular rhythm, normal heart sounds and intact distal pulses.  Exam reveals no gallop and no friction rub.   No murmur heard. Respiratory: Effort normal and breath sounds normal. No stridor. No respiratory distress. She has no wheezes. She has no rales. She exhibits no tenderness.  GI: Soft. Bowel sounds are normal. She exhibits no distension and no mass. There is no tenderness. There is no rebound and no guarding.  Musculoskeletal: She exhibits no edema and no tenderness.  Lymphadenopathy:    She has no cervical adenopathy.  Neurological: She is alert and oriented to person, place, and time. She has normal reflexes. No cranial nerve deficit.  Skin: Skin is warm and dry. No rash noted. She is not diaphoretic. No erythema. No pallor.  Psychiatric: She has a normal mood and affect. Her behavior is normal. Judgment and thought content normal.    Assessment/Plan: Impression:  1. Acute appendicitis 2. History of laparoscopic bilateral tubal fulgration  Plan: We will admit the patient start her on antibiotic and scheduled for surgery later today. Will [place] [name] physician assistant for Dr. Abigail [name].  Christina [name] 02/16/2012, 11:40 AM

## 2012-02-16 NOTE — Op Note (Signed)
APPENDECTOMY LAPAROSCOPIC  Procedure Note  Christina [name] 02/16/2012   Pre-op Diagnosis: appendicitis     Post-op Diagnosis: same  Procedure(s): APPENDECTOMY LAPAROSCOPIC  Surgeon(s): Shelly Rubenstein, MD  Anesthesia: General  Staff:  Jaynee Eagles - Circulator Britt Bottom, CST - Scrub Person Clarnce Flock, CST - Scrub Person  Estimated Blood Loss: Minimal               Specimens: appendix   Findings: The patient was found to have early, acute, nonperforated appendicitis  Procedure: The patient was brought to the operating room and identified as correct patient. She was placed supine on the operating room table and general anesthesia was induced. Her abdomen was then prepped and draped in the usual sterile fashion. I made a small transverse incision through her previous scar just below the umbilicus. I carried this down to the fascia which I then opened with a scalpel. I then used a hemostat to gain entrance into the peritoneal cavity under direct vision. A 0 Vicryl purse sting suture was then placed around the fascial opening. The [facility] port was placed through the opening and insufflation of the abdomen was begun. I then placed another port in the patient's right upper quadrant and another in the patient's left lower quadrant both under direct vision. I was unable to easily identify the cecum and appendix. The appendix was found to be acutely inflamed without evidence of perforation. I took down the mesoappendix with a harmonic scalpel. I then identified the base of the appendix and transected it with the laparoscopic GIA stapler. I then placed the appendix in an Endosac and removed it through the incision at the umbilicus. I then thoroughly irrigated the abdomen with normal saline. Hemostasis appeared to be achieved. All ports were then removed under direct vision and the abdomen was deflated. The 0 Vicryl at the umbilicus was tied in place closing the fascial defect. All  incisions were then anesthetized with Marcaine and closed with 4-0 Monocryl sutures. Dermabond was then applied. The patient tolerated the procedure well. All the counts were correct at the end of the procedure. The patient was then extubated in the operating room and taken in a stable condition to the recovery room.        Ancelmo Hunt A   Date: 02/16/2012  Time: 2:25 PM

## 2012-02-16 NOTE — ED Provider Notes (Signed)
History     CSN: [id number]  Arrival date & time 02/16/12  1046   First MD Initiated Contact with Patient 02/16/12 1052      Chief Complaint  Patient presents with  . Abdominal Pain  . Nausea    HPI Pt was seen at 1100.  Per pt, c/o gradual onset and persistence of constant RLQ pain for the past several days.  Has been assoc with nausea.  Pt was eval by her MD at [facility], was sent to the hospital for a CT scan.  Pt was informed that her CT A/P was +acute appy,and was told to come to the ED to see Surgeon.  Denies vomiting/diarrhea, no CP/SOB.    History reviewed. No pertinent past medical history.  Past Surgical History  Procedure Date  . Tubal ligation     History  Substance Use Topics  . Smoking status: Never Smoker   . Smokeless tobacco: Not on file  . Alcohol Use: No    Review of Systems ROS: Statement: All systems negative except as marked or noted in the HPI; Constitutional: Negative for fever and chills. ; ; Eyes: Negative for eye pain, redness and discharge. ; ; ENMT: Negative for ear pain, hoarseness, nasal congestion, sinus pressure and sore throat. ; ; Cardiovascular: Negative for chest pain, palpitations, diaphoresis, dyspnea and peripheral edema. ; ; Respiratory: Negative for cough, wheezing and stridor. ; ; Gastrointestinal: +nausea, abd pain. Negative for vomiting, diarrhea, blood in stool, hematemesis, jaundice and rectal bleeding. . ; ; Genitourinary: Negative for dysuria, flank pain and hematuria. ; ; Musculoskeletal: Negative for back pain and neck pain. Negative for swelling and trauma.; ; Skin: Negative for pruritus, rash, abrasions, blisters, bruising and skin lesion.; ; Neuro: Negative for headache, lightheadedness and neck stiffness. Negative for weakness, altered level of consciousness , altered mental status, extremity weakness, paresthesias, involuntary movement, seizure and syncope.     Allergies  Review of patient's allergies  indicates no known allergies.  Home Medications   Current Outpatient Rx  Name Route Sig Dispense Refill  . IBUPROFEN 200 MG PO TABS Oral Take 400 mg by mouth every 6 (six) hours as needed.      BP 125/71  Pulse 70  Temp(Src) 98.1 F (36.7 C) (Oral)  Resp 18  SpO2 100%  LMP 01/27/2012  Physical Exam 1100: Physical examination:  Nursing notes reviewed; Vital signs and O2 SAT reviewed;  Constitutional: Well developed, Well nourished, Well hydrated, In no acute distress; Head:  Normocephalic, atraumatic; Eyes: EOMI, PERRL, No scleral icterus; ENMT: Mouth and pharynx normal, Mucous membranes moist; Neck: Supple, Full range of motion; Cardiovascular: Regular rate and rhythm; Respiratory: Breath sounds without audible wheeze, Speaking full sentences, Normal respiratory effort/excursion; Chest: Movement normal; Abdomen: Soft, Normal bowel sounds; Extremities: No deformity, No edema, No calf edema or asymmetry.; Neuro: AA&Ox3, Speech clear, gait steady. Moves all ext well..; Skin: Color normal, Warm, Dry, no rash.    ED Course  Procedures  1105:  T/C to General Surgery, case discussed, including:  HPI, pertinent PM/SHx, VS/PE, dx testing, ED course and treatment:  Agreeable to send PA to ED to eval for admit.     MDM  MDM Reviewed: nursing note and vitals Reviewed previous: CT scan   Ct Abdomen Pelvis W Contrast 02/16/2012  *RADIOLOGY REPORT*  Clinical Data: Lower abdominal pain, especially right lower quadrant.  Elevated white blood cell count.  CT ABDOMEN AND PELVIS WITH CONTRAST  Technique:  Multidetector CT imaging  of the abdomen and pelvis was performed following the standard protocol during bolus administration of intravenous contrast.  Contrast: OMNIPAQUE IOHEXOL 300 MG/ML  SOLN  Comparison: MRI of the pelvis 02/25/2011.  Findings: Lung bases are clear.  No effusions.  Heart is normal size.  Liver, gallbladder, spleen, pancreas, adrenals and left kidney unremarkable.  Small cysts  in the right kidney.  No hydronephrosis. Normal and symmetric enhancement kidneys bilaterally.  The appendix is dilated, measuring up to 10 mm.  Slight inflammatory stranding around the appendix.  Mildly prominent right lower quadrant mesenteric lymph nodes.  Findings compatible with early acute appendicitis.  Uterus, adnexa and urinary bladder are unremarkable.  Large and small bowel grossly unremarkable.  No free fluid, free air or adenopathy.  No acute bony abnormality.  IMPRESSION: Findings compatible with early acute appendicitis.  These results were called to Julio Sicks, nurse  practitioner at the time of interpretation.  Original Report Authenticated By: Cyndie Chime, M.D.           Laray Anger, DO 02/18/12 1134

## 2012-02-17 ENCOUNTER — Encounter (HOSPITAL_COMMUNITY): Payer: Self-pay | Admitting: Surgery

## 2012-02-17 NOTE — Progress Notes (Signed)
1 Day Post-Op  Subjective: Wants to go home.  Eating eggs for breakfast.  Objective: Vital signs in last 24 hours: Temp:  [97 F (36.1 C)-98.5 F (36.9 C)] 98.5 F (36.9 C) (05/10 0534) Pulse Rate:  [70-100] 72  (05/10 0534) Resp:  [12-18] 17  (05/10 0534) BP: (103-129)/(56-82) 107/56 mmHg (05/10 0534) SpO2:  [99 %-100 %] 100 % (05/10 0534) Weight:  [53.524 kg (118 lb)] 53.524 kg (118 lb) (05/09 1530) Last BM Date: 02/16/12  Intake/Output from previous day: 05/09 0701 - 05/10 0700 In: 1785 [P.O.:360; I.V.:1425] Out: 2050 [Urine:2050] Intake/Output this shift: Total I/O In: -  Out: 150 [Urine:150]  General appearance: alert, cooperative and no distress GI: still a bit tender, +BS, no distension.    Lab Results:   Basename 02/16/12 1128  WBC 8.5  HGB 14.0  HCT 41.4  PLT 275    BMET  Basename 02/16/12 1128  NA 136  K 3.9  CL 102  CO2 25  GLUCOSE 84  BUN 13  CREATININE 0.60  CALCIUM 8.7   PT/INR  Basename 02/16/12 1128  LABPROT 14.2  INR 1.08    No results found for this basename: AST:5,ALT:5,ALKPHOS:5,BILITOT:5,PROT:5,ALBUMIN:5 in the last 168 hours   Lipase  No results found for this basename: lipase     Studies/Results: Ct Abdomen Pelvis W Contrast  02/16/2012  *RADIOLOGY REPORT*  Clinical Data: Lower abdominal pain, especially right lower quadrant.  Elevated white blood cell count.  CT ABDOMEN AND PELVIS WITH CONTRAST  Technique:  Multidetector CT imaging of the abdomen and pelvis was performed following the standard protocol during bolus administration of intravenous contrast.  Contrast: OMNIPAQUE IOHEXOL 300 MG/ML  SOLN  Comparison: MRI of the pelvis 02/25/2011.  Findings: Lung bases are clear.  No effusions.  Heart is normal size.  Liver, gallbladder, spleen, pancreas, adrenals and left kidney unremarkable.  Small cysts in the right kidney.  No hydronephrosis. Normal and symmetric enhancement kidneys bilaterally.  The appendix is dilated,  measuring up to 10 mm.  Slight inflammatory stranding around the appendix.  Mildly prominent right lower quadrant mesenteric lymph nodes.  Findings compatible with early acute appendicitis.  Uterus, adnexa and urinary bladder are unremarkable.  Large and small bowel grossly unremarkable.  No free fluid, free air or adenopathy.  No acute bony abnormality.  IMPRESSION: Findings compatible with early acute appendicitis.  These results were called to Julio Sicks, nurse  practitioner at the time of interpretation.  Original Report Authenticated By: Cyndie Chime, M.D.    Medications:    . enoxaparin  40 mg Subcutaneous Q24H  . fentaNYL      . DISCONTD: piperacillin-tazobactam (ZOSYN)  IV  3.375 g Intravenous Q8H    Assessment/Plan 1. Acute appendicitis  2. History of laparoscopic bilateral tubal fulgration    Plan:  D/C Home     LOS: 1 day    Christina [name] 02/17/2012

## 2012-02-17 NOTE — Progress Notes (Signed)
UR complete 

## 2012-02-17 NOTE — Discharge Summary (Signed)
Physician Discharge Summary  Patient ID: Christina [name] MRN: [id number] DOB/AGE: Nov 24, 1965 45 y.o.  Admit date: 02/16/2012 Discharge date: 02/17/2012  Admission Diagnoses: Acute Appendicitis   Discharge Diagnoses: Same Active Problems:  * No active hospital problems. *    PROCEDURES: Laparoscopic Appendectomy 02/16/12  Hospital Course: Pt developed  abdominal pain 2 days ago. She describes as long her abdomen below the umbilicus, and the right lower quadrant. She had a similar episode last year it turned out to be an ovarian cyst. She actually had some syncope with the discomfort from that. She went to Dr. Cletis Media office yesterday and a CT was obtained. This showed a dilated appendix up to 10 mm with slight inflammatory stranding around the appendix. There was also some prominent right lower quadrant mesenteric lymph nodes. This was read as appendicitis and patient was transferred for [facility] for evaluation and treatment. Labs and urinalysis have been requested by the ER physician. We've been asked to see the patient.  Pt was started on Antibiotics, and taken to the OR that afternoon. She did well over night.  Was tolerating full diet and discharged home.  Follow up 2 weeks Dr. Magnus Ivan  Disposition: Final discharge disposition not confirmed   Medication List  As of 02/17/2012  8:30 AM   TAKE these medications         acetaminophen 325 MG tablet   Commonly known as: TYLENOL   Take 2 tablets (650 mg total) by mouth every 6 (six) hours as needed for pain.      HYDROcodone-acetaminophen 5-325 MG per tablet   Commonly known as: NORCO   Take 1-2 tablets by mouth every 4 (four) hours as needed for pain.      ibuprofen 200 MG tablet   Commonly known as: ADVIL,MOTRIN   Take 400 mg by mouth every 6 (six) hours as needed.           Follow-up Information    Follow up with [facility] A, MD. Call in 2 weeks. [phone])    Contact information:   [facility] [address] [place] [address] [phone]          Signed: Sherrie George 02/17/2012, 8:30 AM

## 2012-03-07 ENCOUNTER — Ambulatory Visit (INDEPENDENT_AMBULATORY_CARE_PROVIDER_SITE_OTHER): Payer: Managed Care, Other (non HMO) | Admitting: Surgery

## 2012-03-07 ENCOUNTER — Encounter (INDEPENDENT_AMBULATORY_CARE_PROVIDER_SITE_OTHER): Payer: Self-pay | Admitting: Surgery

## 2012-03-07 VITALS — BP 125/84 | HR 80 | Temp 98.8°F | Ht 62.0 in | Wt 120.4 lb

## 2012-03-07 DIAGNOSIS — Z09 Encounter for follow-up examination after completed treatment for conditions other than malignant neoplasm: Secondary | ICD-10-CM

## 2012-03-07 DIAGNOSIS — Z9049 Acquired absence of other specified parts of digestive tract: Secondary | ICD-10-CM | POA: Insufficient documentation

## 2012-03-07 DIAGNOSIS — Z9889 Other specified postprocedural states: Secondary | ICD-10-CM

## 2012-03-07 NOTE — Progress Notes (Signed)
Subjective:     Patient ID: Christina [name], female   DOB: Jul 24, 1966, 46 y.o.   MRN: [id number]  HPI She is here for her first postop visit status post laparoscopic appendectomy performed on May 9. She is doing well and has no complaints. She has already returned to work  Review of Systems     Objective:   Physical Exam Her incisions are well healed. Her abdomen is soft and nontender. Her final pathology showed acute appendicitis    Assessment:     Patient status post laparoscopic appendectomy    Plan:     She may return to normal activity. I will see her back as needed

## 2012-03-22 ENCOUNTER — Ambulatory Visit (INDEPENDENT_AMBULATORY_CARE_PROVIDER_SITE_OTHER): Payer: Managed Care, Other (non HMO) | Admitting: Surgery

## 2012-03-22 ENCOUNTER — Encounter (INDEPENDENT_AMBULATORY_CARE_PROVIDER_SITE_OTHER): Payer: Self-pay | Admitting: Surgery

## 2012-03-22 VITALS — BP 120/80 | HR 75 | Temp 98.9°F | Resp 12 | Ht 62.5 in | Wt 120.8 lb

## 2012-03-22 DIAGNOSIS — R1033 Periumbilical pain: Secondary | ICD-10-CM

## 2012-03-22 DIAGNOSIS — R109 Unspecified abdominal pain: Secondary | ICD-10-CM

## 2012-03-22 NOTE — Progress Notes (Signed)
Subjective:     Patient ID: Christina [name], female   DOB: 1965/12/14, 46 y.o.   MRN: [id number]  HPI  YANITZA SHVARTSMAN  December 02, 1965 [id number]  Patient Care Team: Juluis Mire, MD as PCP - General (Obstetrics and Gynecology)  This patient is a 46 y.o.female who presents today for surgical evaluation.   Procedure fluoroscopic appendectomy 02/16/2012  Reason for visit: Periumbilical pain.  Patient noted some soreness a few days ago her bellybutton. A little out of a lump there. She was concerned.  No fevers chills or sweats. No nausea or vomiting. Having regular bowel movements. Energy level is good.    Patient Active Problem List  Diagnosis  . S/P appendectomy  . Umbilical pain at incsion, prob muscle strain    History reviewed. No pertinent past medical history.  Past Surgical History  Procedure Date  . Tubal ligation   . Laparoscopic appendectomy 02/16/2012    Procedure: APPENDECTOMY LAPAROSCOPIC;  Surgeon: Shelly Rubenstein, MD;  Location: [facility];  Service: General;  Laterality: N/A;  . Hernia repair     History   Social History  . Marital Status: Married    Spouse Name: N/A    Number of Children: N/A  . Years of Education: N/A   Occupational History  . Not on file.   Social History Main Topics  . Smoking status: Never Smoker   . Smokeless tobacco: Never Used  . Alcohol Use: No  . Drug Use: No  . Sexually Active: No   Other Topics Concern  . Not on file   Social History Narrative  . No narrative on file    History reviewed. No pertinent family history.  Current Outpatient Prescriptions  Medication Sig Dispense Refill  . acetaminophen (TYLENOL) 325 MG tablet Take 2 tablets (650 mg total) by mouth every 6 (six) hours as needed for pain.      [name] ibuprofen (ADVIL,MOTRIN) 200 MG tablet Take 400 mg by mouth every 6 (six) hours as needed.         No Known Allergies  BP 120/80  Pulse 75  Temp 98.9 F (37.2 C) (Temporal)  Resp 12  Ht 5' 2.5" (1.588 m)  Wt  120 lb 12.8 oz (54.795 kg)  BMI 21.74 kg/m2  No results found.   Review of Systems  Constitutional: Negative for fever, chills and diaphoresis.  HENT: Negative for ear pain, sore throat and trouble swallowing.   Eyes: Negative for photophobia and visual disturbance.  Respiratory: Negative for cough and choking.   Cardiovascular: Negative for chest pain and palpitations.  Gastrointestinal: Negative for nausea, vomiting, abdominal pain, diarrhea, constipation, anal bleeding and rectal pain.  Genitourinary: Negative for dysuria, frequency and difficulty urinating.  Musculoskeletal: Negative for myalgias and gait problem.  Skin: Negative for color change, pallor and rash.  Neurological: Negative for dizziness, speech difficulty, weakness and numbness.  Hematological: Negative for adenopathy.  Psychiatric/Behavioral: Negative for confusion and agitation. The patient is not nervous/anxious.        Objective:   Physical Exam  Constitutional: She is oriented to person, place, and time. She appears well-developed and well-nourished. No distress.  HENT:  Head: Normocephalic.  Mouth/Throat: Oropharynx is clear and moist. No oropharyngeal exudate.  Eyes: Conjunctivae and EOM are normal. Pupils are equal, round, and reactive to light. No scleral icterus.  Neck: Normal range of motion. No tracheal deviation present.  Cardiovascular: Normal rate and intact distal pulses.   Pulmonary/Chest: Effort normal. No respiratory distress. She  exhibits no tenderness.  Abdominal: Soft. She exhibits no distension. There is tenderness. Hernia confirmed negative in the right inguinal area and confirmed negative in the left inguinal area.       Incisions clean with normal healing ridges.  No hernias.  Mild periumb soreness  Genitourinary: No vaginal discharge found.  Musculoskeletal: Normal range of motion. She exhibits no tenderness.  Lymphadenopathy:       Right: No inguinal adenopathy present.        Left: No inguinal adenopathy present.  Neurological: She is alert and oriented to person, place, and time. No cranial nerve deficit. She exhibits normal muscle tone. Coordination normal.  Skin: Skin is warm and dry. No rash noted. She is not diaphoretic.  Psychiatric: She has a normal mood and affect. Her behavior is normal.       Assessment:     Umbilical pain, probable strain, no cellulitis/hernia    Plan:     NSAIDS & Heat should help it resolve  Increase activity as tolerated.  Do not push through pain.  Advanced on diet as tolerated. Bowel regimen to avoid problems.  Return to clinic p.r.n. The patient expressed understanding and appreciation

## 2012-03-22 NOTE — Patient Instructions (Addendum)

## 2015-03-17 ENCOUNTER — Other Ambulatory Visit: Payer: Self-pay | Admitting: Obstetrics and Gynecology

## 2015-03-18 LAB — CYTOLOGY - PAP

## 2015-03-19 ENCOUNTER — Encounter: Payer: Self-pay | Admitting: Gastroenterology

## 2015-03-23 ENCOUNTER — Other Ambulatory Visit: Payer: Self-pay | Admitting: Obstetrics and Gynecology

## 2015-03-23 DIAGNOSIS — R928 Other abnormal and inconclusive findings on diagnostic imaging of breast: Secondary | ICD-10-CM

## 2015-03-26 ENCOUNTER — Ambulatory Visit
Admission: RE | Admit: 2015-03-26 | Discharge: 2015-03-26 | Disposition: A | Discharge status: Home / Self Care | Payer: 59 | Source: Ambulatory Visit | Attending: Obstetrics and Gynecology | Admitting: Obstetrics and Gynecology

## 2015-03-26 ENCOUNTER — Other Ambulatory Visit: Payer: Managed Care, Other (non HMO)

## 2015-03-26 DIAGNOSIS — R928 Other abnormal and inconclusive findings on diagnostic imaging of breast: Secondary | ICD-10-CM

## 2015-06-01 ENCOUNTER — Encounter: Payer: Managed Care, Other (non HMO) | Admitting: Gastroenterology

## 2015-10-01 ENCOUNTER — Ambulatory Visit (INDEPENDENT_AMBULATORY_CARE_PROVIDER_SITE_OTHER): Payer: 59 | Admitting: Family Medicine

## 2015-10-01 VITALS — BP 112/72 | HR 85 | Temp 98.5°F | Resp 18 | Ht 63.0 in | Wt 116.0 lb

## 2015-10-01 DIAGNOSIS — R05 Cough: Secondary | ICD-10-CM | POA: Diagnosis not present

## 2015-10-01 DIAGNOSIS — J029 Acute pharyngitis, unspecified: Secondary | ICD-10-CM | POA: Diagnosis not present

## 2015-10-01 DIAGNOSIS — J0100 Acute maxillary sinusitis, unspecified: Secondary | ICD-10-CM | POA: Diagnosis not present

## 2015-10-01 DIAGNOSIS — R059 Cough, unspecified: Secondary | ICD-10-CM

## 2015-10-01 MED ORDER — AZITHROMYCIN 250 MG PO TABS: ORAL_TABLET | ORAL | Status: DC

## 2015-10-01 NOTE — Progress Notes (Signed)
 Chief Complaint:  Chief Complaint  Patient presents with  . chest congestion    x4 , husband sick   . Sore Throat  . sinus pressure    HPI: Christina [name] is a 49 y.o. female who reports to [facility] today complaining of chest congestion, sinus pressure and headaches and otc generic mucinex sinus and laternate the egular mucinex 12 hours, and throat is raw, salt water rinses and dry cough. No fevers orc chills. No rashes, diarrhea, n/v and with minimal abd upset and is expecting her cycle. No wheezing or CP , feels tight in the chest but not actively wheezing.  Was on plane when this occurred. Her husband has been sick as well.  Her daughter just graduated from [employer] [place] in [place]  History reviewed. No pertinent past medical history. Past Surgical History  Procedure Laterality Date  . Tubal ligation    . Laparoscopic appendectomy  02/16/2012    Procedure: APPENDECTOMY LAPAROSCOPIC;  Surgeon: Harl Bowie, MD;  Location: [facility];  Service: General;  Laterality: N/A;  . Hernia repair    . Appendectomy     Social History   Social History  . Marital Status: Married    Spouse Name: N/A  . Number of Children: N/A  . Years of Education: N/A   Social History Main Topics  . Smoking status: Never Smoker   . Smokeless tobacco: Never Used  . Alcohol Use: No  . Drug Use: No  . Sexual Activity: No   Other Topics Concern  . None   Social History Narrative   Family History  Problem Relation Age of Onset  . Heart disease Mother   . Stroke Mother   . Cancer Father    No Known Allergies Prior to Admission medications   Medication Sig Start Date End Date Taking? Authorizing Provider  guaiFENesin (MUCINEX) 600 MG 12 hr tablet Take by mouth 2 (two) times daily.   Yes Historical Provider, MD  ibuprofen (ADVIL,MOTRIN) 200 MG tablet Take 400 mg by mouth every 6 (six) hours as needed. Reported on 10/01/2015    Historical Provider, MD     ROS: The patient denies  fevers, chills, night sweats, unintentional weight loss, chest pain, palpitations, wheezing, dyspnea on exertion, nausea, vomiting, abdominal pain, dysuria, hematuria, melena, numbness, weakness, or tingling.   All other systems have been reviewed and were otherwise negative with the exception of those mentioned in the HPI and as above.    PHYSICAL EXAM: Filed Vitals:   10/01/15 0837  BP: 112/72  Pulse: 85  Temp: 98.5 F (36.9 C)  Resp: 18   Body mass index is 20.55 kg/(m^2).   General: Alert, no acute distress HEENT:  Normocephalic, atraumatic, oropharynx patent. EOMI, PERRLA Erythematous throat, no exudates, TM normal, + sinus tenderness, + erythematous/boggy nasal mucosa Cardiovascular:  Regular rate and rhythm, no rubs murmurs or gallops.  Respiratory: Clear to auscultation bilaterally.  No wheezes, rales, or rhonchi.  No cyanosis, no use of accessory musculature Abdominal: No organomegaly, abdomen is soft and non-tender, positive bowel sounds. No masses. Skin: No rashes. Neurologic: Facial musculature symmetric. Psychiatric: Patient acts appropriately throughout our interaction. Lymphatic: No cervical or submandibular lymphadenopathy Musculoskeletal: Gait intact. No edema, tenderness   LABS: Results for orders placed or performed in visit on 03/17/15  Cytology - PAP  Result Value Ref Range   CYTOLOGY - PAP PAP RESULT      EKG/XRAY:   Primary read interpreted by  Dr. Marin Comment at [facility]   ASSESSMENT/PLAN: Encounter Diagnoses  Name Primary?  . Acute pharyngitis, unspecified pharyngitis type   . Acute maxillary sinusitis, recurrence not specified Yes  . Cough    Rx azithromycin prn but try sxs treatment first Nasocort and cont with mucinex Fu prn   Gross sideeffects, risk and benefits, and alternatives of medications d/w patient. Patient is aware that all medications have potential sideeffects and we are unable to predict every sideeffect or drug-drug interaction that may  occur.    DO  10/01/2015 9:01 AM

## 2015-10-01 NOTE — Patient Instructions (Signed)

## 2016-03-02 ENCOUNTER — Encounter: Payer: Self-pay | Admitting: Gastroenterology

## 2016-04-11 DIAGNOSIS — Z1231 Encounter for screening mammogram for malignant neoplasm of breast: Secondary | ICD-10-CM | POA: Diagnosis not present

## 2016-04-11 DIAGNOSIS — Z01419 Encounter for gynecological examination (general) (routine) without abnormal findings: Secondary | ICD-10-CM | POA: Diagnosis not present

## 2016-04-11 DIAGNOSIS — Z6821 Body mass index (BMI) 21.0-21.9, adult: Secondary | ICD-10-CM | POA: Diagnosis not present

## 2016-04-15 ENCOUNTER — Ambulatory Visit (AMBULATORY_SURGERY_CENTER): Payer: Self-pay | Admitting: *Deleted

## 2016-04-15 VITALS — Ht 62.0 in | Wt 120.8 lb

## 2016-04-15 DIAGNOSIS — Z1211 Encounter for screening for malignant neoplasm of colon: Secondary | ICD-10-CM

## 2016-04-15 MED ORDER — SUPREP BOWEL PREP KIT 17.5-3.13-1.6 GM/177ML PO SOLN: 1.0000 | Freq: Once | ORAL | Status: DC

## 2016-04-15 MED FILL — SUPREP BOWEL PREP KIT: 17.5-3.13-1 | 2 days supply | Qty: 354 | Fill #0

## 2016-04-15 NOTE — Progress Notes (Signed)
Patient denies any allergies to egg or soy products. Patient denies complications with anesthesia/sedation.  Patient denies oxygen use at home and denies diet medications. Emmi instructions for colonoscopy explained but patient denied.  Pamphlet given.  

## 2016-05-02 ENCOUNTER — Encounter: Payer: Self-pay | Admitting: Gastroenterology

## 2016-05-02 ENCOUNTER — Ambulatory Visit (AMBULATORY_SURGERY_CENTER): Payer: 59 | Admitting: Gastroenterology

## 2016-05-02 VITALS — BP 143/88 | HR 79 | Temp 98.2°F | Resp 16 | Ht 62.0 in | Wt 120.0 lb

## 2016-05-02 DIAGNOSIS — Z1211 Encounter for screening for malignant neoplasm of colon: Secondary | ICD-10-CM | POA: Diagnosis present

## 2016-05-02 NOTE — Patient Instructions (Signed)
Discharge instructions given. Handouts on diverticulosis and hemorrhoids. Resume previous medications. YOU HAD AN ENDOSCOPIC PROCEDURE TODAY AT THE [facility]:   Refer to the procedure report that was given to you for any specific questions about what was found during the examination.  If the procedure report does not answer your questions, please call your gastroenterologist to clarify.  If you requested that your care partner not be given the details of your procedure findings, then the procedure report has been included in a sealed envelope for you to review at your convenience later.  YOU SHOULD EXPECT: Some feelings of bloating in the abdomen. Passage of more gas than usual.  Walking can help get rid of the air that was put into your GI tract during the procedure and reduce the bloating. If you had a lower endoscopy (such as a colonoscopy or flexible sigmoidoscopy) you may notice spotting of blood in your stool or on the toilet paper. If you underwent a bowel prep for your procedure, you may not have a normal bowel movement for a few days.  Please Note:  You might notice some irritation and congestion in your nose or some drainage.  This is from the oxygen used during your procedure.  There is no need for concern and it should clear up in a day or so.  SYMPTOMS TO REPORT IMMEDIATELY:   Following lower endoscopy (colonoscopy or flexible sigmoidoscopy):  Excessive amounts of blood in the stool  Significant tenderness or worsening of abdominal pains  Swelling of the abdomen that is new, acute  Fever of 100F or higher   For urgent or emergent issues, a gastroenterologist can be reached at any hour by calling [phone].   DIET: Your first meal following the procedure should be a small meal and then it is ok to progress to your normal diet. Heavy or fried foods are harder to digest and may make you feel nauseous or bloated.  Likewise, meals heavy in dairy and vegetables can  increase bloating.  Drink plenty of fluids but you should avoid alcoholic beverages for 24 hours.  ACTIVITY:  You should plan to take it easy for the rest of today and you should NOT DRIVE or use heavy machinery until tomorrow (because of the sedation medicines used during the test).    FOLLOW UP: Our staff will call the number listed on your records the next business day following your procedure to check on you and address any questions or concerns that you may have regarding the information given to you following your procedure. If we do not reach you, we will leave a message.  However, if you are feeling well and you are not experiencing any problems, there is no need to return our call.  We will assume that you have returned to your regular daily activities without incident.  If any biopsies were taken you will be contacted by phone or by letter within the next 1-3 weeks.  Please call us at [phone] if you have not heard about the biopsies in 3 weeks.    SIGNATURES/CONFIDENTIALITY: You and/or your care partner have signed paperwork which will be entered into your electronic medical record.  These signatures attest to the fact that that the information above on your After Visit Summary has been reviewed and is understood.  Full responsibility of the confidentiality of this discharge information lies with you and/or your care-partner. 

## 2016-05-02 NOTE — Progress Notes (Signed)
Report to PACU, RN, vss, BBS= Clear.  

## 2016-05-02 NOTE — Op Note (Addendum)
[facility] Patient Name: Christina [name] Procedure Date: 05/02/2016 11:27 AM MRN: [id number] Endoscopist: Ladene Artist , MD Age: 50 Referring MD:  Date of Birth: 24-Sep-1966 Gender: Female Account #: [id number] Procedure:                Colonoscopy Indications:              Screening for colorectal malignant neoplasm Medicines:                Monitored Anesthesia Care Procedure:                Pre-Anesthesia Assessment:                           - Prior to the procedure, a History and Physical                            was performed, and patient medications and                            allergies were reviewed. The patient's tolerance of                            previous anesthesia was also reviewed. The risks                            and benefits of the procedure and the sedation                            options and risks were discussed with the patient.                            All questions were answered, and informed consent                            was obtained. Prior Anticoagulants: The patient has                            taken no previous anticoagulant or antiplatelet                            agents. ASA Grade Assessment: I - A normal, healthy                            patient. After reviewing the risks and benefits,                            the patient was deemed in satisfactory condition to                            undergo the procedure.                           After obtaining informed consent, the colonoscope  was passed under direct vision. Throughout the                            procedure, the patient's blood pressure, pulse, and                            oxygen saturations were monitored continuously. The                            Model PCF-[id number]) scope was introduced                            through the anus and advanced to the the cecum,                            identified by appendiceal  orifice and ileocecal                            valve. The ileocecal valve, appendiceal orifice,                            and rectum were photographed. The quality of the                            bowel preparation was excellent. The patient                            tolerated the procedure well. The colonoscopy was                            somewhat difficult due to a tortuous colon.                            Successful completion of the procedure was aided by                            using manual pressure, withdrawing and reinserting                            the scope and straightening and shortening the                            scope to obtain bowel loop reduction. Scope In: 11:31:51 AM Scope Out: 11:48:50 AM Scope Withdrawal Time: 0 hours 9 minutes 27 seconds  Total Procedure Duration: 0 hours 16 minutes 59 seconds  Findings:                 Internal hemorrhoids were found during                            retroflexion. The hemorrhoids were small and Grade                            I (internal hemorrhoids that do not prolapse).  A few small-mouthed diverticula were found in the                            sigmoid colon.                           The exam was otherwise without abnormality on                            direct and retroflexion views. Complications:            No immediate complications. Estimated blood loss:                            None. Estimated Blood Loss:     Estimated blood loss: none. Impression:               - Internal hemorrhoids.                           - Diverticulosis in the sigmoid colon.                           - The examination was otherwise normal on direct                            and retroflexion views.                           - No specimens collected. Recommendation:           - Repeat colonoscopy in 10 years for screening                            purposes.                           - Patient has a  contact number available for                            emergencies. The signs and symptoms of potential                            delayed complications were discussed with the                            patient. Return to normal activities tomorrow.                            Written discharge instructions were provided to the                            patient.                           - Continue present medications.                           -  High fiber diet. Ladene Artist, MD 05/02/2016 11:52:14 AM This report has been signed electronically.

## 2016-05-03 ENCOUNTER — Telehealth: Payer: Self-pay

## 2016-05-03 NOTE — Telephone Encounter (Signed)
  Follow up Call-  Call back number 05/02/2016  Post procedure Call Back phone  # [phone]  Permission to leave phone message Yes  Some recent data might be hidden     Patient questions:  Do you have a fever, pain , or abdominal swelling? No. Pain Score  0 *  Have you tolerated food without any problems? Yes.    Have you been able to return to your normal activities? Yes.    Do you have any questions about your discharge instructions: Diet   No. Medications  No. Follow up visit  No.  Do you have questions or concerns about your Care? No.  Actions: * If pain score is 4 or above: No action needed, pain <4.

## 2016-05-21 ENCOUNTER — Ambulatory Visit (INDEPENDENT_AMBULATORY_CARE_PROVIDER_SITE_OTHER): Payer: 59 | Admitting: Physician Assistant

## 2016-05-21 VITALS — BP 118/68 | HR 88 | Temp 98.6°F | Resp 17 | Ht 62.5 in | Wt 118.0 lb

## 2016-05-21 DIAGNOSIS — R05 Cough: Secondary | ICD-10-CM | POA: Diagnosis not present

## 2016-05-21 DIAGNOSIS — J029 Acute pharyngitis, unspecified: Secondary | ICD-10-CM | POA: Diagnosis not present

## 2016-05-21 DIAGNOSIS — R0981 Nasal congestion: Secondary | ICD-10-CM | POA: Diagnosis not present

## 2016-05-21 DIAGNOSIS — R059 Cough, unspecified: Secondary | ICD-10-CM

## 2016-05-21 MED ORDER — BENZONATATE 100 MG PO CAPS: 100.0000 mg | ORAL_CAPSULE | Freq: Three times a day (TID) | ORAL | 0 refills | Status: DC | PRN

## 2016-05-21 NOTE — Patient Instructions (Addendum)
OTC flonase for left nostril OTC throat lozenge and ibuprofen for sore throat Continue delsym and mucinex  Drink hot tea and honey, get plenty of rest of keep drinking fluids Contact me if symptoms worsen      IF you received an x-ray today, you will receive an invoice from [facility]. Please contact [facility] at [phone] with questions or concerns regarding your invoice.   IF you received labwork today, you will receive an invoice from [employer]. Please contact [facility] at [phone] with questions or concerns regarding your invoice.   Our billing staff will not be able to assist you with questions regarding bills from these companies.  You will be contacted with the lab results as soon as they are available. The fastest way to get your results is to activate your My Chart account. Instructions are located on the last page of this paperwork. If you have not heard from [place] regarding the results in 2 weeks, please contact this office.

## 2016-05-21 NOTE — Progress Notes (Signed)
Christina [name]  MRN: [id number] DOB: 1965/11/11  Subjective:  Christina [name] is a 50 y.o. female seen in office today for a chief complaint of illness x 3 days.   Has associated head/nasal/chest congestion, sore throat, nonproductive cough, ear pressure, myalgias,chilles, diaphoresis, and subjective fever (99.0).Denies chest pain or SOB.  Pt's husband has recently been sick with similar symptoms. Was seen at an urgent care and treated with a zpack.  Pt has tried advil, ibuprofen, and mucinex with mild relief.   Of note, pt's symptoms are getting better, pt's main complaint is uncomfortable throat from coughing episodes.   Review of Systems  Gastrointestinal: Negative for abdominal pain, diarrhea, nausea and vomiting.  Neurological: Negative for dizziness and light-headedness.    Patient Active Problem List   Diagnosis Date Noted  . Umbilical pain at incsion, prob muscle strain 03/22/2012  . S/P appendectomy 03/07/2012    Current Outpatient Prescriptions on File Prior to Visit  Medication Sig Dispense Refill  . ibuprofen (ADVIL,MOTRIN) 200 MG tablet Take 400 mg by mouth every 6 (six) hours as needed. Reported on 10/01/2015    . Lysine 500 MG TABS Take 500 mg by mouth daily.     No current facility-administered medications on file prior to visit.     No Known Allergies  Objective:  BP 118/68 (BP Location: Right Arm, Patient Position: Sitting, Cuff Size: Normal)   Pulse 88   Temp 98.6 F (37 C) (Oral)   Resp 17   Ht 5' 2.5" (1.588 m)   Wt 118 lb (53.5 kg)   LMP 05/10/2016 (Approximate)   SpO2 98%   BMI 21.24 kg/m   Physical Exam  Constitutional: She is oriented to person, place, and time and well-developed, well-nourished, and in no distress.  HENT:  Head: Normocephalic and atraumatic.  Right Ear: Tympanic membrane, external ear and ear canal normal.  Left Ear: Tympanic membrane, external ear and ear canal normal.  Nose: Mucosal edema (in left nostril ) present.  Right sinus exhibits no maxillary sinus tenderness and no frontal sinus tenderness. Left sinus exhibits no maxillary sinus tenderness and no frontal sinus tenderness.  Mouth/Throat: Uvula is midline and mucous membranes are normal. Posterior oropharyngeal erythema present.  Eyes: Conjunctivae are normal.  Neck: Normal range of motion.  Pulmonary/Chest: Effort normal and breath sounds normal. She has no wheezes. She has no rales.  Lymphadenopathy:       Head (right side): No submental, no submandibular, no tonsillar, no preauricular, no posterior auricular and no occipital adenopathy present.       Head (left side): No submental, no submandibular, no tonsillar, no preauricular, no posterior auricular and no occipital adenopathy present.    She has no cervical adenopathy.       Right: No supraclavicular adenopathy present.       Left: No supraclavicular adenopathy present.  Neurological: She is alert and oriented to person, place, and time. Gait normal.  Skin: Skin is warm and dry.  Psychiatric: Affect normal.  Vitals reviewed.   Assessment and Plan :  1. Cough - benzonatate (TESSALON) 100 MG capsule; Take 1-2 capsules (100-200 mg total) by mouth 3 (three) times daily as needed for cough.  Dispense: 40 capsule; Refill: 0 2. Nasal congestion 3. Sore Throat -Likely viral, will treat supportively  -OTC flonase, throat lozenge, delsym, mucinex, and ibuprofen prn for symptoms -Hot tea with honey -If no improvement or develop worsening of symptoms in 7 days, pt will mychart me for discussion  of antibiotic prescription.    Tenna Delaine PA-C  [facility] 05/21/2016 8:19 AM

## 2016-05-22 ENCOUNTER — Telehealth: Payer: 59 | Admitting: Nurse Practitioner

## 2016-05-22 DIAGNOSIS — R05 Cough: Secondary | ICD-10-CM

## 2016-05-22 DIAGNOSIS — R059 Cough, unspecified: Secondary | ICD-10-CM

## 2016-05-22 MED ORDER — BENZONATATE 100 MG PO CAPS: 100.0000 mg | ORAL_CAPSULE | Freq: Three times a day (TID) | ORAL | 0 refills | Status: DC | PRN

## 2016-05-22 MED ORDER — AZITHROMYCIN 250 MG PO TABS: ORAL_TABLET | ORAL | 0 refills | Status: DC

## 2016-05-22 NOTE — Progress Notes (Signed)

## 2016-07-16 ENCOUNTER — Ambulatory Visit (INDEPENDENT_AMBULATORY_CARE_PROVIDER_SITE_OTHER): Payer: 59

## 2016-07-16 ENCOUNTER — Ambulatory Visit (INDEPENDENT_AMBULATORY_CARE_PROVIDER_SITE_OTHER): Payer: 59 | Admitting: Family Medicine

## 2016-07-16 VITALS — BP 126/72 | HR 74 | Temp 98.1°F | Resp 16 | Ht 62.5 in | Wt 122.0 lb

## 2016-07-16 DIAGNOSIS — S99912A Unspecified injury of left ankle, initial encounter: Secondary | ICD-10-CM

## 2016-07-16 NOTE — Progress Notes (Signed)
Chief Complaint  Patient presents with  . Ankle Injury    Left ankle yesterday    HPI Left ankle injury Pt reports was stepping off the curb 24 hours ago when she rolled her ankle She has been icing, elevating and taking ibuprofen She reports that her pain is 6/10 She can wiggle her toes She is able to walk by putting her weight on the heel but cannot weight bear without pain  She has not had previous trauma to the ankle  No past medical history on file.  Current Outpatient Prescriptions  Medication Sig Dispense Refill  . ibuprofen (ADVIL,MOTRIN) 200 MG tablet Take 400 mg by mouth every 6 (six) hours as needed. Reported on 10/01/2015    . Lysine 500 MG TABS Take 500 mg by mouth daily.    . Multiple Vitamins-Minerals (MULTIVITAMIN WITH MINERALS) tablet Take 1 tablet by mouth daily.     No current facility-administered medications for this visit.    No Known Allergies   Past Surgical History:  Procedure Laterality Date  . APPENDECTOMY    . HERNIA REPAIR    . LAPAROSCOPIC APPENDECTOMY  02/16/2012   Procedure: APPENDECTOMY LAPAROSCOPIC;  Surgeon: Harl Bowie, MD;  Location: [facility];  Service: General;  Laterality: N/A;  . TONSILLECTOMY    . TUBAL LIGATION    . WISDOM TOOTH EXTRACTION      Social History   Social History  . Marital status: Married    Spouse name: N/A  . Number of children: N/A  . Years of education: N/A   Social History Main Topics  . Smoking status: Never Smoker  . Smokeless tobacco: Never Used  . Alcohol use No  . Drug use: No  . Sexual activity: No   Other Topics Concern  . None   Social History Narrative  . None    ROS  Objective: Vitals:   07/16/16 1437  BP: 126/72  Pulse: 74  Resp: 16  Temp: 98.1 F (36.7 C)  TempSrc: Oral  SpO2: 99%  Weight: 122 lb (55.3 kg)  Height: 5' 2.5" (1.588 m)    Physical Exam  Constitutional: She is oriented to person, place, and time. She appears well-developed and well-nourished.    HENT:  Head: Normocephalic and atraumatic.  Musculoskeletal:       Right ankle: Normal. She exhibits no swelling and no ecchymosis.       Left ankle: She exhibits swelling and ecchymosis. She exhibits normal pulse. Tenderness. Lateral malleolus tenderness found. Achilles tendon exhibits no pain and normal Thompson's test results.       Right foot: There is normal range of motion and no deformity.       Left foot: There is decreased range of motion. There is no deformity.  Neurological: She is alert and oriented to person, place, and time.  Skin: Skin is warm.    CLINICAL DATA:  50 year old female with a history of ankle injury  EXAM: LEFT ANKLE COMPLETE - 3+ VIEW  COMPARISON:  None.  FINDINGS: No acute fracture. No focal soft tissue swelling. No radiopaque foreign body.  Degenerative changes of the hindfoot.  IMPRESSION: Negative for acute bony abnormality.  Signed,  Dulcy Fanny. Earleen Newport, DO  [facility]  [facility]   Electronically Signed   By: Corrie Mckusick D.O.   On: 07/16/2016 15:54   Assessment and Plan Leafy was seen today for ankle injury.  Diagnoses and all orders for this visit:  Ankle injury, left,  initial encounter- xray consistent with ankle sprain  (left), Future  She was given home instructions to rest, ice, elevate and NSAIDS  Jamayah Myszka A Nolon Rod

## 2016-07-16 NOTE — Patient Instructions (Addendum)
IF you received an x-ray today, you will receive an invoice from [facility]. Please contact [facility] at [phone] with questions or concerns regarding your invoice.   IF you received labwork today, you will receive an invoice from [employer]. Please contact [facility] at [phone] with questions or concerns regarding your invoice.   Our billing staff will not be able to assist you with questions regarding bills from these companies.  You will be contacted with the lab results as soon as they are available. The fastest way to get your results is to activate your My Chart account. Instructions are located on the last page of this paperwork. If you have not heard from [place] regarding the results in 2 weeks, please contact this office.     Ankle Sprain An ankle sprain is an injury to the strong, fibrous tissues (ligaments) that hold the bones of your ankle joint together.  CAUSES An ankle sprain is usually caused by a fall or by twisting your ankle. Ankle sprains most commonly occur when you step on the outer edge of your foot, and your ankle turns inward. People who participate in sports are more prone to these types of injuries.  SYMPTOMS   Pain in your ankle. The pain may be present at rest or only when you are trying to stand or walk.  Swelling.  Bruising. Bruising may develop immediately or within 1 to 2 days after your injury.  Difficulty standing or walking, particularly when turning corners or changing directions. DIAGNOSIS  Your caregiver will ask you details about your injury and perform a physical exam of your ankle to determine if you have an ankle sprain. During the physical exam, your caregiver will press on and apply pressure to specific areas of your foot and ankle. Your caregiver will try to move your ankle in certain ways. An X-ray exam may be done to be sure a bone was not broken or a ligament did not separate from one  of the bones in your ankle (avulsion fracture).  TREATMENT  Certain types of braces can help stabilize your ankle. Your caregiver can make a recommendation for this. Your caregiver may recommend the use of medicine for pain. If your sprain is severe, your caregiver may refer you to a surgeon who helps to restore function to parts of your skeletal system (orthopedist) or a physical therapist. [place] ice to your injury for 1-2 days or as directed by your caregiver. Applying ice helps to reduce inflammation and pain.  Put ice in a plastic bag.  Place a towel between your skin and the bag.  Leave the ice on for 15-20 minutes at a time, every 2 hours while you are awake.  Only take over-the-counter or prescription medicines for pain, discomfort, or fever as directed by your caregiver.  Elevate your injured ankle above the level of your heart as much as possible for 2-3 days.  If your caregiver recommends crutches, use them as instructed. Gradually put weight on the affected ankle. Continue to use crutches or a cane until you can walk without feeling pain in your ankle.  If you have a plaster splint, wear the splint as directed by your caregiver. Do not rest it on anything harder than a pillow for the first 24 hours. Do not put weight on it. Do not get it wet. You may take it off to take a shower or bath.  You may have been  given an elastic bandage to wear around your ankle to provide support. If the elastic bandage is too tight (you have numbness or tingling in your foot or your foot becomes cold and blue), adjust the bandage to make it comfortable.  If you have an air splint, you may blow more air into it or let air out to make it more comfortable. You may take your splint off at night and before taking a shower or bath. Wiggle your toes in the splint several times per day to decrease swelling. SEEK MEDICAL CARE IF:   You have rapidly increasing bruising or  swelling.  Your toes feel extremely cold or you lose feeling in your foot.  Your pain is not relieved with medicine. SEEK IMMEDIATE MEDICAL CARE IF:  Your toes are numb or blue.  You have severe pain that is increasing. MAKE SURE YOU:   Understand these instructions.  Will watch your condition.  Will get help right away if you are not doing well or get worse.   This information is not intended to replace advice given to you by your health care provider. Make sure you discuss any questions you have with your health care provider.   Document Released: 09/26/2005 Document Revised: 10/17/2014 Document Reviewed: 10/08/2011 Elsevier Interactive Patient Education [employer].

## 2016-08-12 DIAGNOSIS — L570 Actinic keratosis: Secondary | ICD-10-CM | POA: Diagnosis not present

## 2016-08-12 DIAGNOSIS — L821 Other seborrheic keratosis: Secondary | ICD-10-CM | POA: Diagnosis not present

## 2016-08-12 DIAGNOSIS — L438 Other lichen planus: Secondary | ICD-10-CM | POA: Diagnosis not present

## 2016-09-19 ENCOUNTER — Ambulatory Visit (INDEPENDENT_AMBULATORY_CARE_PROVIDER_SITE_OTHER): Payer: 59 | Admitting: Family Medicine

## 2016-09-19 VITALS — BP 140/98 | HR 95 | Temp 98.6°F | Resp 16 | Ht 62.5 in | Wt 115.0 lb

## 2016-09-19 DIAGNOSIS — F418 Other specified anxiety disorders: Secondary | ICD-10-CM | POA: Diagnosis not present

## 2016-09-19 MED ORDER — CLONAZEPAM 0.5 MG PO TABS: ORAL_TABLET | ORAL | 0 refills | Status: DC

## 2016-09-19 MED FILL — clonazePAM 0.5 MG TABS: 0.5 | 30 days supply | Qty: 60 | Fill #0

## 2016-09-19 NOTE — Patient Instructions (Addendum)
Take 0.25-0.50 Klonopin for anxiety.  IF you received an x-ray today, you will receive an invoice from [facility]. Please contact [facility] at [phone] with questions or concerns regarding your invoice.   IF you received labwork today, you will receive an invoice from [employer]. Please contact [facility] at [phone] with questions or concerns regarding your invoice.   Our billing staff will not be able to assist you with questions regarding bills from these companies.  You will be contacted with the lab results as soon as they are available. The fastest way to get your results is to activate your My Chart account. Instructions are located on the last page of this paperwork. If you have not heard from [place] regarding the results in 2 weeks, please contact this office.    Generalized Anxiety Disorder Generalized anxiety disorder (GAD) is a mental disorder. It interferes with life functions, including relationships, work, and school. GAD is different from normal anxiety, which everyone experiences at some point in their lives in response to specific life events and activities. Normal anxiety actually helps [place] prepare for and get through these life events and activities. Normal anxiety goes away after the event or activity is over.  GAD causes anxiety that is not necessarily related to specific events or activities. It also causes excess anxiety in proportion to specific events or activities. The anxiety associated with GAD is also difficult to control. GAD can vary from mild to severe. People with severe GAD can have intense waves of anxiety with physical symptoms (panic attacks).  SYMPTOMS The anxiety and worry associated with GAD are difficult to control. This anxiety and worry are related to many life events and activities and also occur more days than not for 6 months or longer. People with GAD also have three or more of the following symptoms (one  or more in children):  Restlessness.   Fatigue.  Difficulty concentrating.   Irritability.  Muscle tension.  Difficulty sleeping or unsatisfying sleep. DIAGNOSIS GAD is diagnosed through an assessment by your health care provider. Your health care provider will ask you questions aboutyour mood,physical symptoms, and events in your life. Your health care provider may ask you about your medical history and use of alcohol or drugs, including prescription medicines. Your health care provider may also do a physical exam and blood tests. Certain medical conditions and the use of certain substances can cause symptoms similar to those associated with GAD. Your health care provider may refer you to a mental health specialist for further evaluation. TREATMENT The following therapies are usually used to treat GAD:   Medication. Antidepressant medication usually is prescribed for long-term daily control. Antianxiety medicines may be added in severe cases, especially when panic attacks occur.   Talk therapy (psychotherapy). Certain types of talk therapy can be helpful in treating GAD by providing support, education, and guidance. A form of talk therapy called cognitive behavioral therapy can teach you healthy ways to think about and react to daily life events and activities.  Stress managementtechniques. These include yoga, meditation, and exercise and can be very helpful when they are practiced regularly. A mental health specialist can help determine which treatment is best for you. Some people see improvement with one therapy. However, other people require a combination of therapies. This information is not intended to replace advice given to you by your health care provider. Make sure you discuss any questions you have with your health care provider. Document Released: 01/21/2013 Document Revised:  10/17/2014 Document Reviewed: 01/21/2013 Elsevier Interactive Patient Education  2017 [employer].

## 2016-09-19 NOTE — Progress Notes (Signed)
Patient ID: Christina [name], female    DOB: 1965/11/17, 50 y.o.   MRN: [id number]  PCP: Darlyn Chamber, MD  Chief Complaint  Patient presents with  . Anxiety    Daughter in hospital/ Blood pressure up X2days    Subjective:   HPI 50 year old female presents for evaluation of anxiety and elevated blood pressure x 2 days. Pt is familiar to [facility], presents newly to me. Reports that her 19 year old daughter is in ICU in critical/grave condition due to infective endocarditis. She has been going back and forth to the hospital, continuing to work, and feels almost like her anxiety "is causing her to jump out of her skin". She has noticed an increase in her blood pressure, running in upper 120's and 130's. Today she is even more concerned as her blood pressure is in the 140's. Baseline blood pressure if less than 120/80. She denies any chest palpation or dizziness.  Reports never taken any medication before. She is currently perimenopausal and is currently experiencing hot and cold flashes. She recently lost her mother and father this year also.  Social History   Social History  . Marital status: Married    Spouse name: N/A  . Number of children: N/A  . Years of education: N/A   Occupational History  . Not on file.   Social History Main Topics  . Smoking status: Never Smoker  . Smokeless tobacco: Never Used  . Alcohol use No  . Drug use: No  . Sexual activity: No   Other Topics Concern  . Not on file   Social History Narrative  . No narrative on file   Family History  Problem Relation Age of Onset  . Heart disease Mother   . Stroke Mother   . Cancer Father   . Colon cancer Neg Hx   . Esophageal cancer Neg Hx   . Rectal cancer Neg Hx   . Stomach cancer Neg Hx     Review of Systems HPI    Patient Active Problem List   Diagnosis Date Noted  . Umbilical pain at incsion, prob muscle strain 03/22/2012  . S/P appendectomy 03/07/2012     Prior to Admission medications     Medication Sig Start Date End Date Taking? Authorizing Provider  ibuprofen (ADVIL,MOTRIN) 200 MG tablet Take 400 mg by mouth every 6 (six) hours as needed. Reported on 10/01/2015   Yes Historical Provider, MD  Lysine 500 MG TABS Take 500 mg by mouth daily.   Yes Historical Provider, MD  Multiple Vitamins-Minerals (MULTIVITAMIN WITH MINERALS) tablet Take 1 tablet by mouth daily.   Yes Historical Provider, MD   No Known Allergies     Objective:  Physical Exam  Constitutional: She is oriented to person, place, and time. She appears well-developed and well-nourished.  HENT:  Head: Normocephalic and atraumatic.  Right Ear: External ear normal.  Left Ear: External ear normal.  Nose: Nose normal.  Mouth/Throat: Oropharynx is clear and moist.  Eyes: Conjunctivae are normal. Pupils are equal, round, and reactive to light.  Neck: Normal range of motion. Neck supple.  Pulmonary/Chest: Effort normal and breath sounds normal.  Musculoskeletal: Normal range of motion.  Neurological: She is alert and oriented to person, place, and time.  Skin: Skin is warm and dry.  Psychiatric: Her behavior is normal. Judgment and thought content normal.  Reasonably anxious, tearful, and fearful.   Vitals:   09/19/16 0837  BP: (!) 140/98  Pulse: 95  Resp: 16  Temp: 98.6 F (37 C)   Assessment & Plan:  1. Situational anxiety Plan: Clonazepam (Klonopin) 0.5 mg tablet, 0.25-0.5 twice daily as needed for anxiety.  Carroll Sage. Kenton Kingfisher, MSN, FNP-C [facility]

## 2016-11-09 ENCOUNTER — Encounter: Payer: Self-pay | Admitting: Physician Assistant

## 2016-11-09 ENCOUNTER — Ambulatory Visit (INDEPENDENT_AMBULATORY_CARE_PROVIDER_SITE_OTHER): Payer: 59 | Admitting: Physician Assistant

## 2016-11-09 ENCOUNTER — Ambulatory Visit (INDEPENDENT_AMBULATORY_CARE_PROVIDER_SITE_OTHER): Payer: 59

## 2016-11-09 VITALS — BP 122/80 | HR 98 | Temp 97.8°F | Resp 18 | Wt 120.2 lb

## 2016-11-09 DIAGNOSIS — R0789 Other chest pain: Secondary | ICD-10-CM | POA: Diagnosis not present

## 2016-11-09 DIAGNOSIS — J40 Bronchitis, not specified as acute or chronic: Secondary | ICD-10-CM | POA: Diagnosis not present

## 2016-11-09 MED ORDER — RANITIDINE HCL 150 MG PO TABS: 150.0000 mg | ORAL_TABLET | Freq: Two times a day (BID) | ORAL | 1 refills | Status: DC

## 2016-11-09 MED FILL — raNITIdine HCL 150 MG TABS: 150 | 30 days supply | Qty: 60 | Fill #0

## 2016-11-09 NOTE — Progress Notes (Signed)
Pre visit review using our clinic review tool, if applicable. No additional management support is needed unless otherwise documented below in the visit note. 

## 2016-11-09 NOTE — Patient Instructions (Signed)
It was great to meet you today!  Please stop by the lab and complete the chest xray on the way out. We will call you with your results.  Start Ranitidine twice daily as prescribed. Take before meals. Try this for at least two weeks, this will help promote healing if your stomach is inflamed.  Please go to the emergency room if you develop any changes in your chest discomfort, any sudden onset of shortness of breath, or any other worrisome symptoms.   Nonspecific Chest Pain Chest pain can be caused by many different conditions. There is always a chance that your pain could be related to something serious, such as a heart attack or a blood clot in your lungs. Chest pain can also be caused by conditions that are not life-threatening. If you have chest pain, it is very important to follow up with your health care provider. What are the causes? Causes of this condition include:  Heartburn.  Pneumonia or bronchitis.  Anxiety or stress.  Inflammation around your heart (pericarditis) or lung (pleuritis or pleurisy).  A blood clot in your lung.  A collapsed lung (pneumothorax). This can develop suddenly on its own (spontaneous pneumothorax) or from trauma to the chest.  Shingles infection (varicella-zoster virus).  Heart attack.  Damage to the bones, muscles, and cartilage that make up your chest wall. This can include:  Bruised bones due to injury.  Strained muscles or cartilage due to frequent or repeated coughing or overwork.  Fracture to one or more ribs.  Sore cartilage due to inflammation (costochondritis). What increases the risk? Risk factors for this condition may include:  Activities that increase your risk for trauma or injury to your chest.  Respiratory infections or conditions that cause frequent coughing.  Medical conditions or overeating that can cause heartburn.  Heart disease or family history of heart disease.  Conditions or health behaviors that increase  your risk of developing a blood clot.  Having had chicken pox (varicella zoster). What are the signs or symptoms? Chest pain can feel like:  Burning or tingling on the surface of your chest or deep in your chest.  Crushing, pressure, aching, or squeezing pain.  Dull or sharp pain that is worse when you move, cough, or take a deep breath.  Pain that is also felt in your back, neck, shoulder, or arm, or pain that spreads to any of these areas. Your chest pain may come and go, or it may stay constant. How is this diagnosed? Lab tests or other studies may be needed to find the cause of your pain. Your health care provider may have you take a test called an ECG (electrocardiogram). An ECG records your heartbeat patterns at the time the test is performed. You may also have other tests, such as:  Transthoracic echocardiogram (TTE). In this test, sound waves are used to create a picture of the heart structures and to look at how blood flows through your heart.  Transesophageal echocardiogram (TEE).This is a more advanced imaging test that takes images from inside your body. It allows your health care provider to see your heart in finer detail.  Cardiac monitoring. This allows your health care provider to monitor your heart rate and rhythm in real time.  Holter monitor. This is a portable device that records your heartbeat and can help to diagnose abnormal heartbeats. It allows your health care provider to track your heart activity for several days, if needed.  Stress tests. These can be done through  exercise or by taking medicine that makes your heart beat more quickly.  Blood tests.  Other imaging tests. How is this treated? Treatment depends on what is causing your chest pain. Treatment may include:  Medicines. These may include:  Acid blockers for heartburn.  Anti-inflammatory medicine.  Pain medicine for inflammatory conditions.  Antibiotic medicine, if an infection is  present.  Medicines to dissolve blood clots.  Medicines to treat coronary artery disease (CAD).  Supportive care for conditions that do not require medicines. This may include:  Resting.  Applying heat or cold packs to injured areas.  Limiting activities until pain decreases. Follow these instructions at home: Medicines  If you were prescribed an antibiotic, take it as told by your health care provider. Do not stop taking the antibiotic even if you start to feel better.  Take over-the-counter and prescription medicines only as told by your health care provider. Lifestyle  Do not use any products that contain nicotine or tobacco, such as cigarettes and e-cigarettes. If you need help quitting, ask your health care provider.  Do not drink alcohol.  Make lifestyle changes as directed by your health care provider. These may include:  Getting regular exercise. Ask your health care provider to suggest some activities that are safe for you.  Eating a heart-healthy diet. A registered dietitian can help you to learn healthy eating options.  Maintaining a healthy weight.  Managing diabetes, if necessary.  Reducing stress, such as with yoga or relaxation techniques. General instructions  Avoid any activities that bring on chest pain.  If heartburn is the cause for your chest pain, raise (elevate) the head of your bed about 6 inches (15 cm) by putting blocks under the legs. Sleeping with more pillows does not effectively relieve heartburn because it only changes the position of your head.  Keep all follow-up visits as told by your health care provider. This is important. This includes any further testing if your chest pain does not go away. Contact a health care provider if:  Your chest pain does not go away.  You have a rash with blisters on your chest.  You have a fever.  You have chills. Get help right away if:  Your chest pain is worse.  You have a cough that gets  worse, or you cough up blood.  You have severe pain in your abdomen.  You have severe weakness.  You faint.  You have sudden, unexplained chest discomfort.  You have sudden, unexplained discomfort in your arms, back, neck, or jaw.  You have shortness of breath at any time.  You suddenly start to sweat, or your skin gets clammy.  You feel nauseous or you vomit.  You suddenly feel light-headed or dizzy.  Your heart begins to beat quickly, or it feels like it is skipping beats. These symptoms may represent a serious problem that is an emergency. Do not wait to see if the symptoms will go away. Get medical help right away. Call your local emergency services (911 in the [place] Do not drive yourself to the hospital.  This information is not intended to replace advice given to you by your health care provider. Make sure you discuss any questions you have with your health care provider. Document Released: 07/06/2005 Document Revised: 06/20/2016 Document Reviewed: 06/20/2016 Elsevier Interactive Patient Education  2017 [employer].

## 2016-11-09 NOTE — Progress Notes (Addendum)
Subjective:    Patient ID: Christina [name], female    DOB: Apr 27, 1966, 51 y.o.   MRN: [id number]  HPI  Christina [name] is a 51 y/o female who presents with complaints of chest pain. She would also like to establish care.  She states that over the past 2-3 days she has had intermittent chest tightness. She denies any provocative or palliative factors, but does state that when she is eating, she often feels like she needs to belch and does belch. The tightness may last a few minutes. She states that the pain is in the center of her chest (and points to her epigastric area), may radiate to her back at times. She denies: diaphoresis, nausea, dyspnea, abdominal pain, painful urination, history of blood clots, recent immobilization, recent travel, arm/jaw pain, gnawing stomach pain. She does endorse significant anxiety in her life, was seen at [place] last month for situational anxiety and was given Klonopin to take prn. She has taken it only 2-3 times and also states that she took half of one 2 days ago before sleeping to see if that helped with her chest tightness symptoms and she said it seemed to help. She denies any melena or pain with fatty meals. She is a non-smoker.  She states that she has had this feeling before in her chest, and it was when she went to [place] in December 2017 with situational anxiety.    Review of Systems  Constitutional: Negative for appetite change, fatigue and unexpected weight change.  HENT: Negative for congestion and trouble swallowing.   Eyes: Negative for visual disturbance.  Respiratory: Positive for chest tightness. Negative for cough and shortness of breath.   Cardiovascular: Positive for chest pain. Negative for palpitations and leg swelling.  Gastrointestinal: Negative for abdominal pain, diarrhea, nausea and vomiting.  Genitourinary: Negative for dysuria and flank pain.  Musculoskeletal: Negative for myalgias.  Neurological: Negative for weakness,  light-headedness, numbness and headaches.     Past Medical History:  Diagnosis Date  . Anxiety   . Generalized headaches      Social History   Social History  . Marital status: Married    Spouse name: N/A  . Number of children: N/A  . Years of education: N/A   Occupational History  . Not on file.   Social History Main Topics  . Smoking status: Never Smoker  . Smokeless tobacco: Never Used  . Alcohol use No  . Drug use: No  . Sexual activity: No   Other Topics Concern  . Not on file   Social History Narrative   She works in [place] for [employer]   She has two children, 82 and 24, one child is addicted to drugs   She likes to read   Walking for exercise    Past Surgical History:  Procedure Laterality Date  . APPENDECTOMY    . HERNIA REPAIR    . LAPAROSCOPIC APPENDECTOMY  02/16/2012   Procedure: APPENDECTOMY LAPAROSCOPIC;  Surgeon: Harl Bowie, MD;  Location: [facility];  Service: General;  Laterality: N/A;  . TONSILLECTOMY    . TUBAL LIGATION    . WISDOM TOOTH EXTRACTION      Family History  Problem Relation Age of Onset  . Heart disease Mother   . Stroke Mother   . Heart attack Mother   . Kidney failure Mother   . Cancer Father     prostate  . Parkinson's disease Father   . Colon cancer Neg Hx   .  Esophageal cancer Neg Hx   . Rectal cancer Neg Hx   . Stomach cancer Neg Hx     No Known Allergies  Current Outpatient Prescriptions on File Prior to Visit  Medication Sig Dispense Refill  . clonazePAM (KLONOPIN) 0.5 MG tablet May take 0.25-0.5 up to twice daily as needed for anxiety. 60 tablet 0  . ibuprofen (ADVIL,MOTRIN) 200 MG tablet Take 400 mg by mouth every 6 (six) hours as needed. Reported on 10/01/2015    . Lysine 500 MG TABS Take 500 mg by mouth daily.    . Multiple Vitamins-Minerals (MULTIVITAMIN WITH MINERALS) tablet Take 1 tablet by mouth daily.     No current facility-administered medications on file prior to visit.     BP 122/80  (BP Location: Left Arm, Patient Position: Sitting, Cuff Size: Normal)   Pulse 98   Temp 97.8 F (36.6 C) (Oral)   Resp 18   Wt 120 lb 3.2 oz (54.5 kg)   LMP 10/10/2016   SpO2 99%   BMI 21.63 kg/m       Objective:   Physical Exam  Constitutional: Vital signs are normal. She appears well-developed and well-nourished. She is cooperative.  HENT:  Head: Normocephalic and atraumatic.  Neck: Carotid bruit is not present.  Cardiovascular: Normal rate, regular rhythm, normal heart sounds, intact distal pulses and normal pulses.   No lower leg swelling, negative Homan's sign  Pulmonary/Chest: Effort normal and breath sounds normal. No accessory muscle usage. No respiratory distress.  Abdominal: There is tenderness in the epigastric area. There is no rigidity, no rebound, no guarding, no CVA tenderness and negative Murphy's sign.  Neurological: She is alert.  Skin: Skin is warm, dry and intact.  Nursing note and vitals reviewed.  EKG tracing is personally reviewed.  EKG notes NSR.  No acute changes.      Assessment & Plan:  1. Chest tightness Patient is clinically stable. EKG with NSR, pulse 76. Suspect GI or anxiety related. CXR to rule out any cardiopulmonary disease. Labs pending. Provided prescription of Ranitidine to see is that helps with symptoms. Advised patient as follows: Please go to the emergency room if you develop any changes in your chest discomfort, any sudden onset of shortness of breath, or any other worrisome symptoms. Will call patient with results. - EKG 12-Lead - DG Chest 2 View; Future - CBC with Differential/Platelet; Future - TSH - CBC with Differential/Platelet - Comprehensive metabolic panel  Inda Coke PA-C 11/09/16

## 2016-11-10 ENCOUNTER — Other Ambulatory Visit: Payer: Self-pay | Admitting: Physician Assistant

## 2016-11-10 LAB — COMPREHENSIVE METABOLIC PANEL
ALBUMIN: 4.6 g/dL (ref 3.5–5.2)
ALK PHOS: 48 U/L (ref 39–117)
ALT: 18 U/L (ref 0–35)
AST: 11 U/L (ref 0–37)
BUN: 16 mg/dL (ref 6–23)
CHLORIDE: 102 meq/L (ref 96–112)
CO2: 31 mEq/L (ref 19–32)
CREATININE: 0.7 mg/dL (ref 0.40–1.20)
Calcium: 9.1 mg/dL (ref 8.4–10.5)
GFR: 93.28 mL/min (ref >60.00)
Glucose, Bld: 80 mg/dL (ref 70–99)
POTASSIUM: 4.2 meq/L (ref 3.5–5.1)
Sodium: 137 mEq/L (ref 135–145)
TOTAL PROTEIN: 6.9 g/dL (ref 6.0–8.3)
Total Bilirubin: 0.2 mg/dL (ref 0.2–1.2)

## 2016-11-10 LAB — CBC WITH DIFFERENTIAL/PLATELET
BASOS PCT: 0.8 % (ref 0.0–3.0)
Basophils Absolute: 0.1 10*3/uL (ref 0.0–0.1)
EOS PCT: 2 % (ref 0.0–5.0)
Eosinophils Absolute: 0.2 10*3/uL (ref 0.0–0.7)
HCT: 42.6 % (ref 36.0–46.0)
HEMOGLOBIN: 14.2 g/dL (ref 12.0–15.0)
LYMPHS PCT: 25.3 % (ref 12.0–46.0)
Lymphs Abs: 2.7 10*3/uL (ref 0.7–4.0)
MCHC: 33.4 g/dL (ref 30.0–36.0)
MCV: 93.6 fl (ref 78.0–100.0)
Monocytes Absolute: 0.8 10*3/uL (ref 0.1–1.0)
Monocytes Relative: 7.6 % (ref 3.0–12.0)
Neutro Abs: 6.9 10*3/uL (ref 1.4–7.7)
Neutrophils Relative %: 64.3 % (ref 43.0–77.0)
Platelets: 301 10*3/uL (ref 150.0–400.0)
RBC: 4.55 Mil/uL (ref 3.87–5.11)
RDW: 13.1 % (ref 11.5–15.5)
WBC: 10.7 10*3/uL — AB (ref 4.0–10.5)

## 2016-11-10 LAB — TSH: TSH: 2.7 u[IU]/mL (ref 0.35–4.50)

## 2016-11-10 MED ORDER — AZITHROMYCIN 250 MG PO TABS: ORAL_TABLET | ORAL | 0 refills | Status: DC

## 2016-11-10 MED ORDER — ALBUTEROL SULFATE HFA 108 (90 BASE) MCG/ACT IN AERS: 2.0000 | INHALATION_SPRAY | Freq: Four times a day (QID) | RESPIRATORY_TRACT | 0 refills | Status: DC | PRN

## 2016-11-10 MED FILL — VENTOLIN HFA 90 MCG INHALER: 108 (90 BAS | 25 days supply | Qty: 18 | Fill #0

## 2016-11-11 MED FILL — AZITHROMYCIN 250 MG TABLET: 250 | 5 days supply | Qty: 6 | Fill #0

## 2016-11-15 NOTE — Progress Notes (Signed)
Medical screening examination/treatment/procedure(s) were performed by non-physician practitioner and as supervising physician I was immediately available for consultation/collaboration. I agree with above. Mckaylah Bettendorf, DO   

## 2016-11-16 ENCOUNTER — Ambulatory Visit (INDEPENDENT_AMBULATORY_CARE_PROVIDER_SITE_OTHER): Payer: 59 | Admitting: Physician Assistant

## 2016-11-16 ENCOUNTER — Encounter: Payer: Self-pay | Admitting: Physician Assistant

## 2016-11-16 VITALS — BP 124/78 | HR 75 | Temp 98.6°F | Resp 18 | Ht 62.5 in | Wt 120.0 lb

## 2016-11-16 DIAGNOSIS — R0789 Other chest pain: Secondary | ICD-10-CM

## 2016-11-16 DIAGNOSIS — N912 Amenorrhea, unspecified: Secondary | ICD-10-CM

## 2016-11-16 LAB — POCT URINE PREGNANCY: PREG TEST UR: NEGATIVE

## 2016-11-16 MED ORDER — GI COCKTAIL ~~LOC~~
30.0000 mL | Freq: Once | ORAL | Status: AC
  Administered 2016-11-16: 30 mL via ORAL

## 2016-11-16 NOTE — Progress Notes (Signed)
Subjective:    Patient ID: Christina [name], female    DOB: 20-Sep-1966, 51 y.o.   MRN: [id number]  HPI  Christina [name] is a 51 y/o female who presents for follow-up of chest tightness. She was last seen on 11/09/2016 for chest tightness. Work up at at that time revealed slightly elevated WBC of 10.7, normal CBC otherwise, normal TSH, and normal CMP. CXR showed "acute or chronic bronchitic changes." EKG was normal. She was prescribed Azithromycin, Albuterol, and Ranitidine.  Since that time, patient has has had significant improvement in belching/GI symptoms, but her chest tightness has lingered. It is not exertional, and no specific position or movement makes the symptoms go away or worsen. She has not had any fever, weakness, syncope, leg swelling, palpitations. She does endorse increased fatigue, but states that her coworkers have been fighting a virus that has also left them fatigued. She endorses adequate sleep. She does state that she skipped her period this month, and that this is only the second time that this has happened. She is sexually active with her husband. She does endorse stress in her life but no more than usual. She also states that this morning she had an episode of low BP of 102/51 and she was symptomatic with a dull HA and feeling of lightheadedness, but her symptoms improved later in the day and her blood pressure returned to her baseline of 120/50.   She took the z-pak as prescribed. She has taken the albuterol approximately once daily, sometimes less. She states that she feels like there is mucus in her chest, and she has tried to bring it up but can't.   Review of Systems  Constitutional: Positive for chills and fatigue. Negative for activity change, appetite change and fever.  HENT: Negative for postnasal drip, sinus pain, sneezing and sore throat.   Respiratory: Positive for cough and chest tightness. Negative for choking and shortness of breath.   Cardiovascular: Negative for  chest pain and leg swelling.  Gastrointestinal: Negative for blood in stool, constipation, diarrhea, nausea and rectal pain.  Genitourinary: Positive for menstrual problem (amenorrhea). Negative for vaginal bleeding.  Musculoskeletal: Negative for joint swelling and myalgias.  Skin: Negative for pallor, rash and wound.  Neurological: Positive for light-headedness and headaches. Negative for seizures, weakness and numbness.  Hematological: Negative for adenopathy.  Psychiatric/Behavioral: The patient is not nervous/anxious.    Past Medical History:  Diagnosis Date  . Anxiety   . Generalized headaches      Social History   Social History  . Marital status: Married    Spouse name: N/A  . Number of children: N/A  . Years of education: N/A   Occupational History  . Not on file.   Social History Main Topics  . Smoking status: Never Smoker  . Smokeless tobacco: Never Used  . Alcohol use No  . Drug use: No  . Sexual activity: No   Other Topics Concern  . Not on file   Social History Narrative   She works in [place] for [employer]   She has two children, 14 and 24, one child is addicted to drugs   She likes to read   Walking for exercise    Past Surgical History:  Procedure Laterality Date  . APPENDECTOMY    . HERNIA REPAIR    . LAPAROSCOPIC APPENDECTOMY  02/16/2012   Procedure: APPENDECTOMY LAPAROSCOPIC;  Surgeon: Harl Bowie, MD;  Location: [facility];  Service: General;  Laterality: N/A;  .  TONSILLECTOMY    . TUBAL LIGATION    . WISDOM TOOTH EXTRACTION      Family History  Problem Relation Age of Onset  . Heart disease Mother   . Stroke Mother   . Heart attack Mother   . Kidney failure Mother   . Cancer Father     prostate  . Parkinson's disease Father   . Colon cancer Neg Hx   . Esophageal cancer Neg Hx   . Rectal cancer Neg Hx   . Stomach cancer Neg Hx     No Known Allergies  Current Outpatient Prescriptions on File Prior to Visit  Medication Sig  Dispense Refill  . albuterol (PROVENTIL HFA;VENTOLIN HFA) 108 (90 Base) MCG/ACT inhaler Inhale 2 puffs into the lungs every 6 (six) hours as needed for wheezing or shortness of breath. 1 Inhaler 0  . clonazePAM (KLONOPIN) 0.5 MG tablet May take 0.25-0.5 up to twice daily as needed for anxiety. 60 tablet 0  . ibuprofen (ADVIL,MOTRIN) 200 MG tablet Take 400 mg by mouth every 6 (six) hours as needed. Reported on 10/01/2015    . Lysine 500 MG TABS Take 500 mg by mouth daily.    . Multiple Vitamins-Minerals (MULTIVITAMIN WITH MINERALS) tablet Take 1 tablet by mouth daily.    . ranitidine (ZANTAC) 150 MG tablet Take 1 tablet (150 mg total) by mouth 2 (two) times daily. 60 tablet 1   No current facility-administered medications on file prior to visit.     BP 124/78 (BP Location: Left Arm, Patient Position: Sitting, Cuff Size: Normal)   Pulse 75   Temp 98.6 F (37 C) (Oral)   Resp 18   Ht 5' 2.5" (1.588 m)   Wt 120 lb (54.4 kg)   LMP 10/10/2016 (Exact Date)   SpO2 97%   BMI 21.60 kg/m       Objective:   Physical Exam  Constitutional: Vital signs are normal. She appears well-developed. She is cooperative.  Non-toxic appearance. She does not have a sickly appearance. She does not appear ill.  HENT:  Head: Normocephalic and atraumatic.  Cardiovascular: Normal rate, regular rhythm and normal heart sounds.   No chest wall tenderness with palpation  Pulmonary/Chest: Effort normal and breath sounds normal. No accessory muscle usage. No respiratory distress.  Lungs clear to auscultation bilaterally  Neurological: She is alert. GCS eye subscore is 4. GCS verbal subscore is 5. GCS motor subscore is 6.  Nursing note and vitals reviewed.  Results for orders placed or performed in visit on 11/16/16  POCT urine pregnancy  Result Value Ref Range   Preg Test, Ur Negative Negative   EKG tracing is personally reviewed.  EKG notes NSR.  No acute changes.      Assessment & Plan:  Chest  tightness EKG without significant changes. Urine pregnancy test negative. GI cocktail provided, patient unable to finish cocktail but did endorse slight improvement of symptoms with medicine. Discussed with patient possible etiologies including, but not limited to anxiety, GERD, resolving bronchitis, cardiac etiology. Based on patient discussion, will refer to cardiology for second opinion and any further evaluation deemed necessary. Continue zantac as this is helping with her belching. Discussed trial of Mucinex to help with symptoms of chest congestion. Patient was advised to go to the ER if she develops any worsening chest pain, chest tightness, shortness of breath, or any other concerning symptoms. - EKG 12-Lead - gi cocktail (Maalox,Lidocaine,Donnatal); Take 30 mLs by mouth once. - Ambulatory referral to Cardiology  Inda Coke PA-C 11/16/16

## 2016-11-16 NOTE — Progress Notes (Signed)
Pre visit review using our clinic review tool, if applicable. No additional management support is needed unless otherwise documented below in the visit note. 

## 2016-11-16 NOTE — Patient Instructions (Addendum)
It was great seeing you today!  Please go to the ER if you develop any worsening chest pain, chest tightness, shortness of breath, or any other concerning symptoms.  I have put in a cardiology referral for you, they will contact you with an appointment.  Nonspecific Chest Pain Chest pain can be caused by many different conditions. There is always a chance that your pain could be related to something serious, such as a heart attack or a blood clot in your lungs. Chest pain can also be caused by conditions that are not life-threatening. If you have chest pain, it is very important to follow up with your health care provider. What are the causes? Causes of this condition include:  Heartburn.  Pneumonia or bronchitis.  Anxiety or stress.  Inflammation around your heart (pericarditis) or lung (pleuritis or pleurisy).  A blood clot in your lung.  A collapsed lung (pneumothorax). This can develop suddenly on its own (spontaneous pneumothorax) or from trauma to the chest.  Shingles infection (varicella-zoster virus).  Heart attack.  Damage to the bones, muscles, and cartilage that make up your chest wall. This can include:  Bruised bones due to injury.  Strained muscles or cartilage due to frequent or repeated coughing or overwork.  Fracture to one or more ribs.  Sore cartilage due to inflammation (costochondritis). What increases the risk? Risk factors for this condition may include:  Activities that increase your risk for trauma or injury to your chest.  Respiratory infections or conditions that cause frequent coughing.  Medical conditions or overeating that can cause heartburn.  Heart disease or family history of heart disease.  Conditions or health behaviors that increase your risk of developing a blood clot.  Having had chicken pox (varicella zoster). What are the signs or symptoms? Chest pain can feel like:  Burning or tingling on the surface of your chest or deep  in your chest.  Crushing, pressure, aching, or squeezing pain.  Dull or sharp pain that is worse when you move, cough, or take a deep breath.  Pain that is also felt in your back, neck, shoulder, or arm, or pain that spreads to any of these areas. Your chest pain may come and go, or it may stay constant. How is this diagnosed? Lab tests or other studies may be needed to find the cause of your pain. Your health care provider may have you take a test called an ECG (electrocardiogram). An ECG records your heartbeat patterns at the time the test is performed. You may also have other tests, such as:  Transthoracic echocardiogram (TTE). In this test, sound waves are used to create a picture of the heart structures and to look at how blood flows through your heart.  Transesophageal echocardiogram (TEE).This is a more advanced imaging test that takes images from inside your body. It allows your health care provider to see your heart in finer detail.  Cardiac monitoring. This allows your health care provider to monitor your heart rate and rhythm in real time.  Holter monitor. This is a portable device that records your heartbeat and can help to diagnose abnormal heartbeats. It allows your health care provider to track your heart activity for several days, if needed.  Stress tests. These can be done through exercise or by taking medicine that makes your heart beat more quickly.  Blood tests.  Other imaging tests. How is this treated? Treatment depends on what is causing your chest pain. Treatment may include:  Medicines. These may  include:  Acid blockers for heartburn.  Anti-inflammatory medicine.  Pain medicine for inflammatory conditions.  Antibiotic medicine, if an infection is present.  Medicines to dissolve blood clots.  Medicines to treat coronary artery disease (CAD).  Supportive care for conditions that do not require medicines. This may include:  Resting.  Applying heat  or cold packs to injured areas.  Limiting activities until pain decreases. Follow these instructions at home: Medicines  If you were prescribed an antibiotic, take it as told by your health care provider. Do not stop taking the antibiotic even if you start to feel better.  Take over-the-counter and prescription medicines only as told by your health care provider. Lifestyle  Do not use any products that contain nicotine or tobacco, such as cigarettes and e-cigarettes. If you need help quitting, ask your health care provider.  Do not drink alcohol.  Make lifestyle changes as directed by your health care provider. These may include:  Getting regular exercise. Ask your health care provider to suggest some activities that are safe for you.  Eating a heart-healthy diet. A registered dietitian can help you to learn healthy eating options.  Maintaining a healthy weight.  Managing diabetes, if necessary.  Reducing stress, such as with yoga or relaxation techniques. General instructions  Avoid any activities that bring on chest pain.  If heartburn is the cause for your chest pain, raise (elevate) the head of your bed about 6 inches (15 cm) by putting blocks under the legs. Sleeping with more pillows does not effectively relieve heartburn because it only changes the position of your head.  Keep all follow-up visits as told by your health care provider. This is important. This includes any further testing if your chest pain does not go away. Contact a health care provider if:  Your chest pain does not go away.  You have a rash with blisters on your chest.  You have a fever.  You have chills. Get help right away if:  Your chest pain is worse.  You have a cough that gets worse, or you cough up blood.  You have severe pain in your abdomen.  You have severe weakness.  You faint.  You have sudden, unexplained chest discomfort.  You have sudden, unexplained discomfort in your  arms, back, neck, or jaw.  You have shortness of breath at any time.  You suddenly start to sweat, or your skin gets clammy.  You feel nauseous or you vomit.  You suddenly feel light-headed or dizzy.  Your heart begins to beat quickly, or it feels like it is skipping beats. These symptoms may represent a serious problem that is an emergency. Do not wait to see if the symptoms will go away. Get medical help right away. Call your local emergency services (911 in the [place] Do not drive yourself to the hospital.  This information is not intended to replace advice given to you by your health care provider. Make sure you discuss any questions you have with your health care provider. Document Released: 07/06/2005 Document Revised: 06/20/2016 Document Reviewed: 06/20/2016 Elsevier Interactive Patient Education  2017 [employer].

## 2016-12-06 ENCOUNTER — Encounter: Payer: Self-pay | Admitting: Cardiology

## 2016-12-06 ENCOUNTER — Ambulatory Visit (INDEPENDENT_AMBULATORY_CARE_PROVIDER_SITE_OTHER): Payer: 59 | Admitting: Cardiology

## 2016-12-06 ENCOUNTER — Ambulatory Visit (INDEPENDENT_AMBULATORY_CARE_PROVIDER_SITE_OTHER)
Admission: RE | Admit: 2016-12-06 | Discharge: 2016-12-06 | Disposition: A | Discharge status: Home / Self Care | Payer: Self-pay | Source: Ambulatory Visit | Attending: Cardiology | Admitting: Cardiology

## 2016-12-06 VITALS — BP 124/76 | HR 70 | Ht 62.5 in | Wt 120.4 lb

## 2016-12-06 DIAGNOSIS — R079 Chest pain, unspecified: Secondary | ICD-10-CM

## 2016-12-06 NOTE — Progress Notes (Signed)
Cardiology Office Note    Date:  12/06/2016   ID:  Christina [name], DOB [birth year], MRN [id number]  PCP:  Inda Coke, PA  Cardiologist:  Fransico Him, MD   Chief Complaint  Patient presents with  . Chest Pain    History of Present Illness:  Christina [name] is a 51 y.o. female with no prior cardiac history but history of anxiety who was seen by her PCP for chest tightness.  She was initially seen 11/09/2016 for CP and fatigue and EKG was normal.  She was started on zantac because there was a component of belching with GI symptoms as well as antibx for possible acute bronchitis but she says that she never had respiratory symptoms.  Her symptoms improved some but CP has still lingered some.  Her CP is nonexertional and not changed by position or body movement changes.  She describes it as sharp and pressure like but now is more like a heaviness.  It can last as long as 30 minutes.  If she takes a deep breath in it helps. She denies any associated symptoms of N/V, diaphoresis or SOB with the CP.  She denies any SOB, DOE, PND, orthopnea, palpitations, LE edema.  She has had a few episodes of presyncope in the past but they have been very widely spread apart over the years.  She has never had them evaluated.  She would get clammy and doesn't think she passed out.  She has never smoked but her mom did have an MI at age 46yo and then had a CVA and died.  She is now referred for further cardiac workup.   Past Medical History:  Diagnosis Date  . Anxiety   . Generalized headaches     Past Surgical History:  Procedure Laterality Date  . APPENDECTOMY    . HERNIA REPAIR    . LAPAROSCOPIC APPENDECTOMY  02/16/2012   Procedure: APPENDECTOMY LAPAROSCOPIC;  Surgeon: Harl Bowie, MD;  Location: [facility];  Service: General;  Laterality: N/A;  . TONSILLECTOMY    . TUBAL LIGATION    . WISDOM TOOTH EXTRACTION      Current Medications: Current Meds  Medication Sig  . albuterol (PROVENTIL  HFA;VENTOLIN HFA) 108 (90 Base) MCG/ACT inhaler Inhale 2 puffs into the lungs every 6 (six) hours as needed for wheezing or shortness of breath.  . clonazePAM (KLONOPIN) 0.5 MG tablet May take 0.25-0.5 up to twice daily as needed for anxiety.  [name] ibuprofen (ADVIL,MOTRIN) 200 MG tablet Take 400 mg by mouth every 6 (six) hours as needed. Reported on 10/01/2015  . Lysine 500 MG TABS Take 500 mg by mouth daily.  . Multiple Vitamins-Minerals (MULTIVITAMIN WITH MINERALS) tablet Take 1 tablet by mouth daily.  . ranitidine (ZANTAC) 150 MG tablet Take 1 tablet (150 mg total) by mouth 2 (two) times daily.    Allergies:   Patient has no known allergies.   Social History   Social History  . Marital status: Married    Spouse name: N/A  . Number of children: N/A  . Years of education: N/A   Social History Main Topics  . Smoking status: Never Smoker  . Smokeless tobacco: Never Used  . Alcohol use No  . Drug use: No  . Sexual activity: No   Other Topics Concern  . None   Social History Narrative   She works in [place] for [employer]   She has two children, 49 and 24, one child is addicted to  drugs   She likes to read   Walking for exercise     Family History:  The patient's family history includes Cancer in her father; Heart attack in her mother; Heart disease in her mother; Kidney failure in her mother; Parkinson's disease in her father; Stroke in her mother.   ROS:   Please see the history of present illness.    ROS All other systems reviewed and are negative.  No flowsheet data found.     PHYSICAL EXAM:   VS:  BP 124/76   Pulse 70   Ht 5' 2.5" (1.588 m)   Wt 120 lb 6.4 oz (54.6 kg)   SpO2 99%   BMI 21.67 kg/m    GEN: Well nourished, well developed, in no acute distress  HEENT: normal  Neck: no JVD, carotid bruits, or masses Cardiac: RRR; no murmurs, rubs, or gallops,no edema.  Intact distal pulses bilaterally.  Respiratory:  clear to auscultation bilaterally, normal work  of breathing GI: soft, nontender, nondistended, + BS MS: no deformity or atrophy  Skin: warm and dry, no rash Neuro:  Alert and Oriented x 3, Strength and sensation are intact Psych: euthymic mood, full affect  Wt Readings from Last 3 Encounters:  12/06/16 120 lb 6.4 oz (54.6 kg)  11/16/16 120 lb (54.4 kg)  11/09/16 120 lb 3.2 oz (54.5 kg)      Studies/Labs Reviewed:   EKG:  EKG is not ordered today.   Recent Labs: 11/09/2016: ALT 18; BUN 16; Creatinine, Ser 0.70; Hemoglobin 14.2; Platelets 301.0; Potassium 4.2; Sodium 137; TSH 2.70   Lipid Panel No results found for: CHOL, TRIG, HDL, CHOLHDL, VLDL, LDLCALC, LDLDIRECT  Additional studies/ records that were reviewed today include:  Office notes from PCP, EKG    ASSESSMENT:    1. Chest pain, unspecified type      PLAN:  In order of problems listed above:  1. Chest pain with atypical components.  There has been some GI symptoms of belching associated with it that have improved on Zantac but the low grade CP still is present.  Her EKG is nonischemic.  Her only CRF is family history of CAD.  I will get an ETT to rule out ischemia.  Suspect this is GI related and may be aggravated by stress.  I will also order a 2D echo to assess LVF and rule out pericardial effusion.  I will set her up for a chest CT coronary calcium score for risk factor modification.     Medication Adjustments/Labs and Tests Ordered: Current medicines are reviewed at length with the patient today.  Concerns regarding medicines are outlined above.  Medication changes, Labs and Tests ordered today are listed in the Patient Instructions below.  There are no Patient Instructions on file for this visit.   Signed, Fransico Him, MD  12/06/2016 2:09 PM    [facility] [address], [place], [place]  [address] Phone: [phone]; Fax: [phone]

## 2016-12-06 NOTE — Patient Instructions (Signed)
Medication Instructions:  Your physician recommends that you continue on your current medications as directed. Please refer to the Current Medication list given to you today.   Labwork: None  Testing/Procedures: Your physician has requested that you have an exercise tolerance test. For further information please visit [email or web address]. Please also follow instruction sheet, as given.  Your physician has requested that you have an echocardiogram. Echocardiography is a painless test that uses sound waves to create images of your heart. It provides your doctor with information about the size and shape of your heart and how well your heart's chambers and valves are working. This procedure takes approximately one hour. There are no restrictions for this procedure.  Dr. Radford Pax recommends you have a CORONARY CALCIUM SCORE TODAY.  Follow-Up: Your physician recommends that you schedule a follow-up appointment AS NEEDED with Dr. Radford Pax pending study results.  Any Other Special Instructions Will Be Listed Below (If Applicable).     If you need a refill on your cardiac medications before your next appointment, please call your pharmacy.

## 2016-12-07 ENCOUNTER — Telehealth: Payer: Self-pay | Admitting: Cardiology

## 2016-12-07 NOTE — Telephone Encounter (Signed)
Informed patient of results and verbal understanding expressed.  

## 2016-12-07 NOTE — Telephone Encounter (Signed)
-----   Message from Sueanne Margarita, MD sent at 12/07/2016  9:27 AM EST ----- Calcium score is 0

## 2016-12-07 NOTE — Telephone Encounter (Signed)
New message   Pt is calling about calcium score. She is asking for results.

## 2016-12-09 ENCOUNTER — Ambulatory Visit (INDEPENDENT_AMBULATORY_CARE_PROVIDER_SITE_OTHER): Payer: 59

## 2016-12-09 DIAGNOSIS — R079 Chest pain, unspecified: Secondary | ICD-10-CM | POA: Diagnosis not present

## 2016-12-09 LAB — EXERCISE TOLERANCE TEST
CHL CUP STRESS STAGE 1 DBP: 91 mmHg
CHL CUP STRESS STAGE 1 GRADE: 0 %
CHL CUP STRESS STAGE 1 HR: 73 {beats}/min
CHL CUP STRESS STAGE 1 SBP: 128 mmHg
CHL CUP STRESS STAGE 1 SPEED: 0 mph
CHL CUP STRESS STAGE 2 HR: 72 {beats}/min
CHL CUP STRESS STAGE 4 GRADE: 10 %
CHL CUP STRESS STAGE 4 HR: 100 {beats}/min
CHL CUP STRESS STAGE 4 SBP: 148 mmHg
CHL CUP STRESS STAGE 4 SPEED: 1.7 mph
CHL CUP STRESS STAGE 5 DBP: 85 mmHg
CHL CUP STRESS STAGE 5 GRADE: 12 %
CHL CUP STRESS STAGE 5 HR: 121 {beats}/min
CHL CUP STRESS STAGE 5 SPEED: 2.5 mph
CHL CUP STRESS STAGE 6 DBP: 89 mmHg
CHL CUP STRESS STAGE 6 HR: 146 {beats}/min
CHL CUP STRESS STAGE 6 SBP: 230 mmHg
CHL CUP STRESS STAGE 7 GRADE: 14 %
CHL CUP STRESS STAGE 8 DBP: 84 mmHg
CHL CUP STRESS STAGE 8 HR: 115 {beats}/min
CHL CUP STRESS STAGE 9 GRADE: 0 %
CHL CUP STRESS STAGE 9 HR: 82 {beats}/min
CHL CUP STRESS STAGE 9 SPEED: 0 mph
Estimated workload: 10.1 METS
Exercise duration (min): 9 min
Exercise duration (sec): 0 s
MPHR: 168 {beats}/min
Peak HR: 146 {beats}/min
Percent HR: 88 %
Percent of predicted max HR: 86 %
RPE: 14
Rest HR: 70 {beats}/min
Stage 2 Grade: 0 %
Stage 2 Speed: 1 mph
Stage 3 Grade: 0 %
Stage 3 HR: 71 {beats}/min
Stage 3 Speed: 1 mph
Stage 4 DBP: 84 mmHg
Stage 5 SBP: 191 mmHg
Stage 6 Grade: 14 %
Stage 6 Speed: 3.4 mph
Stage 7 HR: 146 {beats}/min
Stage 7 Speed: 3.4 mph
Stage 8 Grade: 0 %
Stage 8 SBP: 184 mmHg
Stage 8 Speed: 1.5 mph

## 2016-12-12 ENCOUNTER — Other Ambulatory Visit (HOSPITAL_COMMUNITY): Payer: 59

## 2016-12-13 ENCOUNTER — Telehealth: Payer: Self-pay | Admitting: Cardiology

## 2016-12-13 DIAGNOSIS — R9431 Abnormal electrocardiogram [ECG] [EKG]: Secondary | ICD-10-CM

## 2016-12-13 NOTE — Telephone Encounter (Signed)
-----   Message from Sueanne Margarita, MD sent at 12/11/2016  7:50 PM EST ----- Abnormal stress test - please set up for nuclear stress test

## 2016-12-13 NOTE — Telephone Encounter (Signed)
Informed patient of results and verbal understanding expressed.  Myoview ordered for scheduling. Patient understands to fast for two hours prior to test, avoid caffeine and decaf for 12 hours, and to dress prepared to exercise. She agrees with treatment plan.

## 2016-12-13 NOTE — Telephone Encounter (Signed)
Follow Up:    Pt would like her stress test results from Friday please.

## 2016-12-14 ENCOUNTER — Telehealth (HOSPITAL_COMMUNITY): Payer: Self-pay | Admitting: *Deleted

## 2016-12-14 ENCOUNTER — Ambulatory Visit (HOSPITAL_COMMUNITY)
Admission: RE | Admit: 2016-12-14 | Discharge: 2016-12-14 | Disposition: A | Discharge status: Home / Self Care | Payer: 59 | Source: Ambulatory Visit | Attending: Cardiology | Admitting: Cardiology

## 2016-12-14 DIAGNOSIS — I081 Rheumatic disorders of both mitral and tricuspid valves: Secondary | ICD-10-CM | POA: Diagnosis not present

## 2016-12-14 DIAGNOSIS — R079 Chest pain, unspecified: Secondary | ICD-10-CM | POA: Diagnosis not present

## 2016-12-14 NOTE — Telephone Encounter (Signed)
Patient given detailed instructions per Myocardial Perfusion Study Information Sheet for the test on 12/15/16 at 7:15. Patient notified to arrive 15 minutes early and that it is imperative to arrive on time for appointment to keep from having the test rescheduled.  If you need to cancel or reschedule your appointment, please call the office within 24 hours of your appointment. Failure to do so may result in a cancellation of your appointment, and a $50 no show fee. Patient verbalized understanding.Christina [name]

## 2016-12-14 NOTE — Progress Notes (Signed)
  Echocardiogram 2D Echocardiogram has been performed.  Darlina Sicilian M 12/14/2016, 11:48 AM

## 2016-12-15 ENCOUNTER — Ambulatory Visit (HOSPITAL_COMMUNITY): Payer: 59 | Attending: Internal Medicine

## 2016-12-15 DIAGNOSIS — R9431 Abnormal electrocardiogram [ECG] [EKG]: Secondary | ICD-10-CM | POA: Insufficient documentation

## 2016-12-15 LAB — MYOCARDIAL PERFUSION IMAGING
CHL CUP NUCLEAR SDS: 2
CHL CUP RESTING HR STRESS: 77 {beats}/min
CSEPEDS: 0 s
CSEPPHR: 153 {beats}/min
Estimated workload: 10.1 METS
Exercise duration (min): 8 min
LVDIAVOL: 64 mL (ref 46–106)
LVSYSVOL: 24 mL
MPHR: 168 {beats}/min
Percent HR: 91 %
RATE: 0.25
RPE: 17
SRS: 0
SSS: 2
TID: 0.89

## 2016-12-15 MED ORDER — TECHNETIUM TC 99M TETROFOSMIN IV KIT
10.6000 | PACK | Freq: Once | INTRAVENOUS | Status: AC | PRN
  Administered 2016-12-15: 10.6 via INTRAVENOUS
  Filled 2016-12-15: qty 11

## 2016-12-15 MED ORDER — TECHNETIUM TC 99M TETROFOSMIN IV KIT
33.0000 | PACK | Freq: Once | INTRAVENOUS | Status: AC | PRN
  Administered 2016-12-15: 33 via INTRAVENOUS
  Filled 2016-12-15: qty 33

## 2016-12-16 MED FILL — raNITIdine HCL 150 MG TABS: 150 | 30 days supply | Qty: 60 | Fill #1

## 2017-02-08 ENCOUNTER — Other Ambulatory Visit: Payer: Self-pay | Admitting: Physician Assistant

## 2017-02-08 MED FILL — raNITIdine HCL 150 MG TABS: 150 | 30 days supply | Qty: 60 | Fill #0

## 2017-04-06 MED FILL — raNITIdine HCL 150 MG TABS: 150 | 30 days supply | Qty: 60 | Fill #1

## 2017-04-24 DIAGNOSIS — H524 Presbyopia: Secondary | ICD-10-CM | POA: Diagnosis not present

## 2017-04-25 ENCOUNTER — Other Ambulatory Visit (HOSPITAL_COMMUNITY)
Admission: RE | Admit: 2017-04-25 | Discharge: 2017-04-25 | Disposition: A | Discharge status: Home / Self Care | Payer: 59 | Source: Ambulatory Visit | Attending: Obstetrics and Gynecology | Admitting: Obstetrics and Gynecology

## 2017-04-25 DIAGNOSIS — Z1321 Encounter for screening for nutritional disorder: Secondary | ICD-10-CM | POA: Diagnosis not present

## 2017-04-25 DIAGNOSIS — Z1329 Encounter for screening for other suspected endocrine disorder: Secondary | ICD-10-CM | POA: Diagnosis not present

## 2017-04-25 DIAGNOSIS — Z13220 Encounter for screening for lipoid disorders: Secondary | ICD-10-CM | POA: Diagnosis not present

## 2017-04-25 DIAGNOSIS — Z131 Encounter for screening for diabetes mellitus: Secondary | ICD-10-CM | POA: Diagnosis not present

## 2017-04-25 LAB — LIPID PANEL
CHOLESTEROL: 197 mg/dL (ref 0–200)
HDL: 58 mg/dL (ref >40)
LDL Cholesterol: 122 mg/dL — ABNORMAL HIGH (ref 0–99)
Total CHOL/HDL Ratio: 3.4 RATIO
Triglycerides: 86 mg/dL (ref <150)
VLDL: 17 mg/dL (ref 0–40)

## 2017-04-25 LAB — GLUCOSE, FASTING: GLUCOSE, FASTING: 96 mg/dL (ref 65–99)

## 2017-04-26 LAB — VITAMIN D 25 HYDROXY (VIT D DEFICIENCY, FRACTURES): Vit D, 25-Hydroxy: 32.6 ng/mL (ref 30.0–100.0)

## 2017-04-27 DIAGNOSIS — Z01419 Encounter for gynecological examination (general) (routine) without abnormal findings: Secondary | ICD-10-CM | POA: Diagnosis not present

## 2017-04-27 DIAGNOSIS — Z1231 Encounter for screening mammogram for malignant neoplasm of breast: Secondary | ICD-10-CM | POA: Diagnosis not present

## 2017-04-27 DIAGNOSIS — Z6821 Body mass index (BMI) 21.0-21.9, adult: Secondary | ICD-10-CM | POA: Diagnosis not present

## 2017-04-27 LAB — THYROID PANEL WITH TSH
Free Thyroxine Index: 1.6 (ref 1.2–4.9)
T3 UPTAKE RATIO: 25 % (ref 24–39)
T4 TOTAL: 6.2 ug/dL (ref 4.5–12.0)
TSH: 2.32 u[IU]/mL (ref 0.450–4.500)

## 2017-05-08 DIAGNOSIS — R1904 Left lower quadrant abdominal swelling, mass and lump: Secondary | ICD-10-CM | POA: Diagnosis not present

## 2017-05-17 DIAGNOSIS — D2262 Melanocytic nevi of left upper limb, including shoulder: Secondary | ICD-10-CM | POA: Diagnosis not present

## 2017-05-17 DIAGNOSIS — D2261 Melanocytic nevi of right upper limb, including shoulder: Secondary | ICD-10-CM | POA: Diagnosis not present

## 2017-05-17 DIAGNOSIS — D485 Neoplasm of uncertain behavior of skin: Secondary | ICD-10-CM | POA: Diagnosis not present

## 2017-05-17 DIAGNOSIS — D2272 Melanocytic nevi of left lower limb, including hip: Secondary | ICD-10-CM | POA: Diagnosis not present

## 2017-05-17 DIAGNOSIS — L821 Other seborrheic keratosis: Secondary | ICD-10-CM | POA: Diagnosis not present

## 2017-05-17 DIAGNOSIS — D225 Melanocytic nevi of trunk: Secondary | ICD-10-CM | POA: Diagnosis not present

## 2017-05-17 DIAGNOSIS — D2271 Melanocytic nevi of right lower limb, including hip: Secondary | ICD-10-CM | POA: Diagnosis not present

## 2017-06-02 DIAGNOSIS — R431 Parosmia: Secondary | ICD-10-CM | POA: Diagnosis not present

## 2017-06-02 DIAGNOSIS — M26629 Arthralgia of temporomandibular joint, unspecified side: Secondary | ICD-10-CM | POA: Diagnosis not present

## 2017-06-04 DIAGNOSIS — M26629 Arthralgia of temporomandibular joint, unspecified side: Secondary | ICD-10-CM | POA: Insufficient documentation

## 2017-06-15 MED FILL — raNITIdine HCL 150 MG TABS: 150 | 30 days supply | Qty: 60 | Fill #2

## 2017-06-21 DIAGNOSIS — M79642 Pain in left hand: Secondary | ICD-10-CM | POA: Diagnosis not present

## 2017-06-21 DIAGNOSIS — M79641 Pain in right hand: Secondary | ICD-10-CM | POA: Diagnosis not present

## 2017-06-21 DIAGNOSIS — M19032 Primary osteoarthritis, left wrist: Secondary | ICD-10-CM | POA: Diagnosis not present

## 2017-06-21 DIAGNOSIS — M19031 Primary osteoarthritis, right wrist: Secondary | ICD-10-CM | POA: Diagnosis not present

## 2017-06-21 DIAGNOSIS — M180 Bilateral primary osteoarthritis of first carpometacarpal joints: Secondary | ICD-10-CM | POA: Diagnosis not present

## 2017-07-17 ENCOUNTER — Telehealth: Payer: Self-pay | Admitting: Physician Assistant

## 2017-07-17 NOTE — Telephone Encounter (Signed)
Patient called in reference to wanting to know what needed to be done to get a referral to rheumatology. Please call patient and advise. OK to leave message.

## 2017-07-17 NOTE — Telephone Encounter (Signed)
Patient needs an appointment with me so we can discuss health issues and I can put in referral (if appropriate) to the appropriate specialist.  Inda Coke PA-C 07/17/17

## 2017-07-17 NOTE — Telephone Encounter (Signed)
Spoke to pt, told her Aldona Bar said she needs an appointment so we can discuss health issues so she can put appropriate referral in. Pt verbalized understanding.Tried to schedule pt told her I will have scheduling call her right back to schedule. Pt verbalized understanding. Asked Hildred Alamin to call pt and schedule appt.

## 2017-07-18 ENCOUNTER — Ambulatory Visit (INDEPENDENT_AMBULATORY_CARE_PROVIDER_SITE_OTHER): Payer: 59 | Admitting: Physician Assistant

## 2017-07-18 ENCOUNTER — Encounter: Payer: Self-pay | Admitting: Physician Assistant

## 2017-07-18 VITALS — BP 112/72 | HR 63 | Temp 98.4°F | Ht 62.0 in | Wt 115.2 lb

## 2017-07-18 DIAGNOSIS — Z114 Encounter for screening for human immunodeficiency virus [HIV]: Secondary | ICD-10-CM

## 2017-07-18 DIAGNOSIS — R6889 Other general symptoms and signs: Secondary | ICD-10-CM | POA: Diagnosis not present

## 2017-07-18 DIAGNOSIS — R5383 Other fatigue: Secondary | ICD-10-CM

## 2017-07-18 DIAGNOSIS — R209 Unspecified disturbances of skin sensation: Secondary | ICD-10-CM

## 2017-07-18 DIAGNOSIS — Z8739 Personal history of other diseases of the musculoskeletal system and connective tissue: Secondary | ICD-10-CM

## 2017-07-18 DIAGNOSIS — Z1159 Encounter for screening for other viral diseases: Secondary | ICD-10-CM | POA: Diagnosis not present

## 2017-07-18 NOTE — Patient Instructions (Addendum)
You will be contacted about your labs and your referral to rheumatology.

## 2017-07-18 NOTE — Progress Notes (Signed)
Christina [name] is a 51 y.o. female is here to discuss: Health Issues  I acted as a Education administrator for [employer], PA-C Anselmo Pickler, LPN  History of Present Illness:   Chief Complaint  Patient presents with  . Follow-up  . Discuss health issues    Raynaud's, ganglion cyst    HPI   Finger discoloration and cold extremities -- last week she noticed significant discoloration to her R index finger -- appeared white after a long day of surgery and was numb. Over the past month, she has noticed intermittent issues with this. She also experiences discoloration to the bottom of her bilateral great and second toes. She feels that over the past month she has had increased sensation of feeling cold on her extremities (hands and feet) despite use of trying to keep these areas warm. Overall, these symptoms are getting worse with time. Denies fevers, myalgias, unexplained cough or weight loss. She does feel that at times her lower mid-lip and fingernail beds can get blue. She denies hx of frostbite, excessive exposure to vibration of her hands/feet with her job. Her job in the [place] does require her to have to hold on to retractors for long periods of time.   She does have a history of bilateral wrist pain. She has seen Dr. Fredna Dow with [facility] ortho on 06/21/17 for this as well as intermittent R index finger numbness. She was dx with [facility] osteoarthritis and bilateral ganglion cysts, and it was suggested that her R finger numbness was 2/2 holding the retractors as much as she does. She is R handed. She was sent in a compounded anti-inflammatory cream for this --> Diclofenac 3%/ Baclofen 2%/ Cyclobenzaprine 2%/ Lidocaine 2%  Fatigue -- worsening over the past month. TSH, T3 and T4 was last checked on 04/25/17 and was normal. She doesn't endorse any changes in medication, diet or lifestyle that she can relate to the fatigue. She is getting adequate sleep.   Health Maintenance Due  Topic Date Due  . Hepatitis C  Screening  12/20/1963  . HIV Screening  07/16/1979    Past Medical History:  Diagnosis Date  . Anxiety   . Generalized headaches      Social History   Social History  . Marital status: Married    Spouse name: N/A  . Number of children: N/A  . Years of education: N/A   Occupational History  . Not on file.   Social History Main Topics  . Smoking status: Never Smoker  . Smokeless tobacco: Never Used  . Alcohol use No  . Drug use: No  . Sexual activity: No   Other Topics Concern  . Not on file   Social History Narrative   She works in [place] for [employer]   She has two children, 57 and 24, one child is addicted to drugs   She likes to read   Walking for exercise    Past Surgical History:  Procedure Laterality Date  . APPENDECTOMY    . HERNIA REPAIR    . LAPAROSCOPIC APPENDECTOMY  02/16/2012   Procedure: APPENDECTOMY LAPAROSCOPIC;  Surgeon: Harl Bowie, MD;  Location: [facility];  Service: General;  Laterality: N/A;  . TONSILLECTOMY    . TUBAL LIGATION    . WISDOM TOOTH EXTRACTION      Family History  Problem Relation Age of Onset  . Heart disease Mother   . Stroke Mother   . Heart attack Mother   . Kidney failure  Mother   . Cancer Father        prostate  . Parkinson's disease Father   . Colon cancer Neg Hx   . Esophageal cancer Neg Hx   . Rectal cancer Neg Hx   . Stomach cancer Neg Hx     PMHx, SurgHx, SocialHx, FamHx, Medications, and Allergies were reviewed in the Visit Navigator and updated as appropriate.   Patient Active Problem List   Diagnosis Date Noted  . Umbilical pain at incsion, prob muscle strain 03/22/2012  . S/P appendectomy 03/07/2012    Social History  Substance Use Topics  . Smoking status: Never Smoker  . Smokeless tobacco: Never Used  . Alcohol use No    Current Medications and Allergies:    Current Outpatient Prescriptions:  .  B Complex Vitamins (VITAMIN B COMPLEX PO), Take by mouth., Disp: , Rfl:  .   clonazePAM (KLONOPIN) 0.5 MG tablet, May take 0.25-0.5 up to twice daily as needed for anxiety., Disp: 60 tablet, Rfl: 0 .  ibuprofen (ADVIL,MOTRIN) 200 MG tablet, Take 400 mg by mouth every 6 (six) hours as needed. Reported on 10/01/2015, Disp: , Rfl:  .  Lysine 500 MG TABS, Take 500 mg by mouth daily., Disp: , Rfl:  .  Multiple Vitamins-Minerals (MULTIVITAMIN WITH MINERALS) tablet, Take 1 tablet by mouth daily., Disp: , Rfl:  .  ranitidine (ZANTAC) 150 MG tablet, TAKE 1 TABLET BY MOUTH TWICE A DAY, Disp: 60 tablet, Rfl: 2  No Known Allergies  Review of Systems   Review of Systems  All other systems reviewed and are negative.   Vitals:   Vitals:   07/18/17 1506  BP: 112/72  Pulse: 63  Temp: 98.4 F (36.9 C)  TempSrc: Oral  SpO2: 99%  Weight: 115 lb 4 oz (52.3 kg)  Height: 5\' 2"  (1.575 m)     Body mass index is 21.08 kg/m.   Physical Exam:    Physical Exam  Constitutional: She appears well-developed. She is cooperative.  Non-toxic appearance. She does not have a sickly appearance. She does not appear ill. No distress.  Cardiovascular: Normal rate, regular rhythm, S1 normal, S2 normal, normal heart sounds and normal pulses.   Pulses:      Radial pulses are 2+ on the right side, and 2+ on the left side.       Dorsalis pedis pulses are 2+ on the right side, and 2+ on the left side.       Posterior tibial pulses are 2+ on the right side, and 2+ on the left side.  No LE edema  Pulmonary/Chest: Effort normal and breath sounds normal.  Musculoskeletal:  No discoloration of nail beds, fingertips, or bilateral feet.   Bilateral ganglion cyst to bilateral anterior wrists; tender with deep palpation.  Neurological: She is alert. GCS eye subscore is 4. GCS verbal subscore is 5. GCS motor subscore is 6.  Skin: Skin is warm, dry and intact.  Psychiatric: She has a normal mood and affect. Her speech is normal and behavior is normal.  Nursing note and vitals reviewed.      Assessment and Plan:    Christina [name] was seen today for follow-up and discuss health issues.  Diagnoses and all orders for this visit:  Fatigue, unspecified type; Cold extremities; History of arthritis Will obtain routine lab work. TSH was checked <3 months ago and was normal, will not re-check at this time. Patient is requesting a referral to rheumatologist for further evaluation of her symptoms. I  have put this in for her.  -     CBC with Differential/Platelet -     Comprehensive metabolic panel -     Ambulatory referral to Rheumatology  Encounter for screening for other viral diseases -     Hepatitis C antibody  Encounter for screening for HIV -     HIV antibody  . Reviewed expectations re: course of current medical issues. . Discussed self-management of symptoms. . Outlined signs and symptoms indicating need for more acute intervention. . Patient verbalized understanding and all questions were answered. . See orders for this visit as documented in the electronic medical record. . Patient received an After Visit Summary.  CMA or LPN served as scribe during this visit. History, Physical, and Plan performed by medical provider. Documentation and orders reviewed and attested to.  Inda Coke, PA-C [place] [place] 07/18/2017  Follow-up: No Follow-up on file.

## 2017-07-19 LAB — CBC WITH DIFFERENTIAL/PLATELET
BASOS ABS: 0.1 10*3/uL (ref 0.0–0.1)
BASOS PCT: 0.7 % (ref 0.0–3.0)
Eosinophils Absolute: 0.1 10*3/uL (ref 0.0–0.7)
Eosinophils Relative: 1.9 % (ref 0.0–5.0)
HEMATOCRIT: 42.2 % (ref 36.0–46.0)
HEMOGLOBIN: 14 g/dL (ref 12.0–15.0)
LYMPHS PCT: 33.4 % (ref 12.0–46.0)
Lymphs Abs: 2.5 10*3/uL (ref 0.7–4.0)
MCHC: 33.2 g/dL (ref 30.0–36.0)
MCV: 94.1 fl (ref 78.0–100.0)
MONOS PCT: 7.3 % (ref 3.0–12.0)
Monocytes Absolute: 0.6 10*3/uL (ref 0.1–1.0)
NEUTROS ABS: 4.3 10*3/uL (ref 1.4–7.7)
Neutrophils Relative %: 56.7 % (ref 43.0–77.0)
PLATELETS: 316 10*3/uL (ref 150.0–400.0)
RBC: 4.48 Mil/uL (ref 3.87–5.11)
RDW: 13.1 % (ref 11.5–15.5)
WBC: 7.6 10*3/uL (ref 4.0–10.5)

## 2017-07-19 LAB — COMPREHENSIVE METABOLIC PANEL
ALBUMIN: 4.6 g/dL (ref 3.5–5.2)
ALK PHOS: 48 U/L (ref 39–117)
ALT: 7 U/L (ref 0–35)
AST: 13 U/L (ref 0–37)
BILIRUBIN TOTAL: 0.4 mg/dL (ref 0.2–1.2)
BUN: 14 mg/dL (ref 6–23)
CALCIUM: 9.7 mg/dL (ref 8.4–10.5)
CO2: 29 meq/L (ref 19–32)
CREATININE: 0.66 mg/dL (ref 0.40–1.20)
Chloride: 102 mEq/L (ref 96–112)
GFR: 99.57 mL/min (ref >60.00)
Glucose, Bld: 88 mg/dL (ref 70–99)
Potassium: 4.3 mEq/L (ref 3.5–5.1)
Sodium: 139 mEq/L (ref 135–145)
Total Protein: 6.6 g/dL (ref 6.0–8.3)

## 2017-07-19 LAB — HEPATITIS C ANTIBODY
HEP C AB: NONREACTIVE
SIGNAL TO CUT-OFF: 0 (ref <1.00)

## 2017-07-19 LAB — HIV ANTIBODY (ROUTINE TESTING W REFLEX): HIV: NONREACTIVE

## 2017-07-31 DIAGNOSIS — I7300 Raynaud's syndrome without gangrene: Secondary | ICD-10-CM | POA: Diagnosis not present

## 2017-07-31 DIAGNOSIS — M79643 Pain in unspecified hand: Secondary | ICD-10-CM | POA: Diagnosis not present

## 2017-07-31 DIAGNOSIS — M2550 Pain in unspecified joint: Secondary | ICD-10-CM | POA: Diagnosis not present

## 2017-07-31 DIAGNOSIS — M1990 Unspecified osteoarthritis, unspecified site: Secondary | ICD-10-CM | POA: Diagnosis not present

## 2017-07-31 DIAGNOSIS — R5383 Other fatigue: Secondary | ICD-10-CM | POA: Diagnosis not present

## 2017-07-31 DIAGNOSIS — D8989 Other specified disorders involving the immune mechanism, not elsewhere classified: Secondary | ICD-10-CM | POA: Diagnosis not present

## 2017-08-15 DIAGNOSIS — M1990 Unspecified osteoarthritis, unspecified site: Secondary | ICD-10-CM | POA: Diagnosis not present

## 2017-08-15 DIAGNOSIS — M79643 Pain in unspecified hand: Secondary | ICD-10-CM | POA: Diagnosis not present

## 2017-08-15 DIAGNOSIS — M2550 Pain in unspecified joint: Secondary | ICD-10-CM | POA: Diagnosis not present

## 2017-08-15 DIAGNOSIS — I7300 Raynaud's syndrome without gangrene: Secondary | ICD-10-CM | POA: Diagnosis not present

## 2017-08-18 ENCOUNTER — Other Ambulatory Visit: Payer: Self-pay | Admitting: Physician Assistant

## 2017-08-18 MED FILL — raNITIdine HCL 150 MG TABS: 150 | 30 days supply | Qty: 60 | Fill #0

## 2017-10-10 HISTORY — PX: BREAST EXCISIONAL BIOPSY: SUR124

## 2017-10-11 MED FILL — raNITIdine HCL 150 MG TABS: 150 | 30 days supply | Qty: 60 | Fill #1 | Status: TO

## 2017-11-07 ENCOUNTER — Encounter: Payer: Self-pay | Admitting: Emergency Medicine

## 2017-11-07 ENCOUNTER — Other Ambulatory Visit: Payer: Self-pay

## 2017-11-07 ENCOUNTER — Ambulatory Visit (INDEPENDENT_AMBULATORY_CARE_PROVIDER_SITE_OTHER): Payer: 59 | Admitting: Emergency Medicine

## 2017-11-07 VITALS — BP 112/74 | HR 70 | Temp 98.7°F | Resp 16 | Ht 62.0 in | Wt 117.8 lb

## 2017-11-07 DIAGNOSIS — L03032 Cellulitis of left toe: Secondary | ICD-10-CM

## 2017-11-07 MED ORDER — CEFADROXIL 500 MG PO CAPS: 500.0000 mg | ORAL_CAPSULE | Freq: Two times a day (BID) | ORAL | 0 refills | Status: AC

## 2017-11-07 MED ORDER — MUPIROCIN 2 % EX OINT: TOPICAL_OINTMENT | CUTANEOUS | 1 refills | Status: DC

## 2017-11-07 MED FILL — CEFADROXIL 500 MG CAPSULE: 500 | 7 days supply | Qty: 14 | Fill #0

## 2017-11-07 MED FILL — MUPIROCIN 2% OINTMENT: 2 | 7 days supply | Qty: 22 | Fill #0

## 2017-11-07 NOTE — Progress Notes (Signed)
Christina [name] 52 y.o.   Chief Complaint  Patient presents with  . Toe Pain    LEFT 2nd toe with tenderness x 1 1/2 weeks    HISTORY OF PRESENT ILLNESS: This is a 52 y.o. female complaining of redness and swelling of the left second toe.  Denies any injuries.  Started 2 weeks ago.  HPI   Prior to Admission medications   Medication Sig Start Date End Date Taking? Authorizing Provider  B Complex Vitamins (VITAMIN B COMPLEX PO) Take by mouth.   Yes [provider]  ibuprofen (ADVIL,MOTRIN) 200 MG tablet Take 400 mg by mouth every 6 (six) hours as needed. Reported on 10/01/2015   Yes [provider]  Lysine 500 MG TABS Take 500 mg by mouth daily.   Yes [provider]  Multiple Vitamins-Minerals (MULTIVITAMIN WITH MINERALS) tablet Take 1 tablet by mouth daily.   Yes [provider]  ranitidine (ZANTAC) 150 MG tablet TAKE 1 TABLET BY MOUTH TWICE A DAY 08/18/17  Yes Inda Coke, PA  clonazePAM (KLONOPIN) 0.5 MG tablet May take 0.25-0.5 up to twice daily as needed for anxiety. Patient not taking: Reported on 11/07/2017 09/19/16   Scot Jun, FNP    No Known Allergies  Patient Active Problem List   Diagnosis Date Noted  . Umbilical pain at incsion, prob muscle strain 03/22/2012  . S/P appendectomy 03/07/2012    Past Medical History:  Diagnosis Date  . Anxiety   . Generalized headaches     Past Surgical History:  Procedure Laterality Date  . APPENDECTOMY    . HERNIA REPAIR    . LAPAROSCOPIC APPENDECTOMY  02/16/2012   Procedure: APPENDECTOMY LAPAROSCOPIC;  Surgeon: Harl Bowie, MD;  Location: [facility];  Service: General;  Laterality: N/A;  . TONSILLECTOMY    . TUBAL LIGATION    . WISDOM TOOTH EXTRACTION      Social History   Socioeconomic History  . Marital status: Married    Spouse name: Not on file  . Number of children: Not on file  . Years of education: Not on file  . Highest education level: Not on file  Social  Needs  . Financial resource strain: Not on file  . Food insecurity - worry: Not on file  . Food insecurity - inability: Not on file  . Transportation needs - medical: Not on file  . Transportation needs - non-medical: Not on file  Occupational History  . Not on file  Tobacco Use  . Smoking status: Never Smoker  . Smokeless tobacco: Never Used  Substance and Sexual Activity  . Alcohol use: No    Alcohol/week: 0.0 oz  . Drug use: No  . Sexual activity: No    Birth control/protection: Surgical  Other Topics Concern  . Not on file  Social History Narrative   She works in [place] for [employer]   She has two children, 73 and 24, one child is addicted to drugs   She likes to read   Walking for exercise    Family History  Problem Relation Age of Onset  . Heart disease Mother   . Stroke Mother   . Heart attack Mother   . Kidney failure Mother   . Cancer Father        prostate  . Parkinson's disease Father   . Colon cancer Neg Hx   . Esophageal cancer Neg Hx   . Rectal cancer Neg Hx   . Stomach cancer Neg Hx  Review of Systems  Constitutional: Negative.  Negative for chills and fever.  Eyes: Negative for discharge and redness.  Cardiovascular: Negative for chest pain and palpitations.  Gastrointestinal: Negative for nausea and vomiting.  Skin: Negative.   Neurological: Negative.  Negative for dizziness and headaches.  Endo/Heme/Allergies: Negative.   All other systems reviewed and are negative.   Vitals:   11/07/17 1548  BP: 112/74  Pulse: 70  Resp: 16  Temp: 98.7 F (37.1 C)  SpO2: 100%    Physical Exam  Constitutional: She is oriented to person, place, and time. She appears well-developed and well-nourished.  HENT:  Head: Normocephalic and atraumatic.  Eyes: Pupils are equal, round, and reactive to light.  Cardiovascular: Normal rate and intact distal pulses.  Pulmonary/Chest: Effort normal.  Musculoskeletal:  Left foot: Distal second toe shows an  open wound with surrounding erythema and swelling.NVI.  Good peripheral pulses with good capillary refill.  Neurological: She is alert and oriented to person, place, and time.  Skin: Skin is warm.  Psychiatric: She has a normal mood and affect. Her behavior is normal.  Vitals reviewed.    ASSESSMENT & PLAN: Christina [name] was seen today for toe pain.  Diagnoses and all orders for this visit:  Cellulitis of left toe -     cefadroxil (DURICEF) 500 MG capsule; Take 1 capsule (500 mg total) by mouth 2 (two) times daily for 7 days. -     mupirocin ointment (BACTROBAN) 2 %; Sig to affected area twice a day x 7 days.    Patient Instructions       IF you received an x-ray today, you will receive an invoice from [facility]. Please contact [facility] at [phone] with questions or concerns regarding your invoice.   IF you received labwork today, you will receive an invoice from [facility]. Please contact [facility] at [phone] with questions or concerns regarding your invoice.   Our billing staff will not be able to assist you with questions regarding bills from these companies.  You will be contacted with the lab results as soon as they are available. The fastest way to get your results is to activate your My Chart account. Instructions are located on the last page of this paperwork. If you have not heard from [place] regarding the results in 2 weeks, please contact this office.     Cellulitis, Adult Cellulitis is a skin infection. The infected area is usually red and sore. This condition occurs most often in the arms and lower legs. It is very important to get treated for this condition. Follow these instructions at home:  Take over-the-counter and prescription medicines only as told by your doctor.  If you were prescribed an antibiotic medicine, take it as told by your doctor. Do not stop taking the antibiotic even if you start to feel better.  Drink enough fluid to keep  your pee (urine) clear or pale yellow.  Do not touch or rub the infected area.  Raise (elevate) the infected area above the level of your heart while you are sitting or lying down.  Place warm or cold wet cloths (warm or cold compresses) on the infected area. Do this as told by your doctor.  Keep all follow-up visits as told by your doctor. This is important. These visits let your doctor make sure your infection is not getting worse. Contact a doctor if:  You have a fever.  Your symptoms do not get better after 1-2 days of treatment.  Your  bone or joint under the infected area starts to hurt after the skin has healed.  Your infection comes back. This can happen in the same area or another area.  You have a swollen bump in the infected area.  You have new symptoms.  You feel ill and also have muscle aches and pains. Get help right away if:  Your symptoms get worse.  You feel very sleepy.  You throw up (vomit) or have watery poop (diarrhea) for a long time.  There are red streaks coming from the infected area.  Your red area gets larger.  Your red area turns darker. This information is not intended to replace advice given to you by your health care provider. Make sure you discuss any questions you have with your health care provider. Document Released: 03/14/2008 Document Revised: 03/03/2016 Document Reviewed: 08/05/2015 Elsevier Interactive Patient Education  2018 Elsevier [employer].     Agustina Caroli, MD [facility]

## 2017-11-07 NOTE — Patient Instructions (Addendum)
     IF you received an x-ray today, you will receive an invoice from [facility]. Please contact [facility] at [phone] with questions or concerns regarding your invoice.   IF you received labwork today, you will receive an invoice from [facility]. Please contact [facility] at [phone] with questions or concerns regarding your invoice.   Our billing staff will not be able to assist you with questions regarding bills from these companies.  You will be contacted with the lab results as soon as they are available. The fastest way to get your results is to activate your My Chart account. Instructions are located on the last page of this paperwork. If you have not heard from us regarding the results in 2 weeks, please contact this office.      Cellulitis, Adult Cellulitis is a skin infection. The infected area is usually red and sore. This condition occurs most often in the arms and lower legs. It is very important to get treated for this condition. Follow these instructions at home:  Take over-the-counter and prescription medicines only as told by your doctor.  If you were prescribed an antibiotic medicine, take it as told by your doctor. Do not stop taking the antibiotic even if you start to feel better.  Drink enough fluid to keep your pee (urine) clear or pale yellow.  Do not touch or rub the infected area.  Raise (elevate) the infected area above the level of your heart while you are sitting or lying down.  Place warm or cold wet cloths (warm or cold compresses) on the infected area. Do this as told by your doctor.  Keep all follow-up visits as told by your doctor. This is important. These visits let your doctor make sure your infection is not getting worse. Contact a doctor if:  You have a fever.  Your symptoms do not get better after 1-2 days of treatment.  Your bone or joint under the infected area starts to hurt after the skin has healed.  Your  infection comes back. This can happen in the same area or another area.  You have a swollen bump in the infected area.  You have new symptoms.  You feel ill and also have muscle aches and pains. Get help right away if:  Your symptoms get worse.  You feel very sleepy.  You throw up (vomit) or have watery poop (diarrhea) for a long time.  There are red streaks coming from the infected area.  Your red area gets larger.  Your red area turns darker. This information is not intended to replace advice given to you by your health care provider. Make sure you discuss any questions you have with your health care provider. Document Released: 03/14/2008 Document Revised: 03/03/2016 Document Reviewed: 08/05/2015 Elsevier Interactive Patient Education  2018 Elsevier Inc.  

## 2018-01-19 IMAGING — NM NM MISC PROCEDURE
3 series · 18 of 18 positions shown · non-contrast
Comparison: none

[Series 1: rest_(id)_sa · 6.4mm · 6.40mm/px · 6 of 64 frames shown]
[frame 6/64]
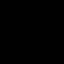
[frame 16/64]
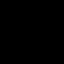
[frame 27/64]
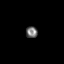
[frame 38/64]
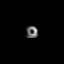
[frame 48/64]
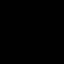
[frame 59/64]
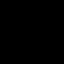

[Series 1: stress-sum-em_(id)_sa · 6.4mm · 6.40mm/px · 6 of 64 frames shown]
[frame 6/64]
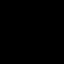
[frame 16/64]
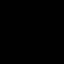
[frame 27/64]
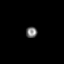
[frame 38/64]
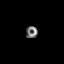
[frame 48/64]
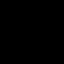
[frame 59/64]
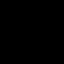

[Series 1: stress-gsp_(id)_sa · 6.4mm · 6.40mm/px · 6 of 512 frames shown]
[frame 43/512]
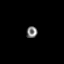
[frame 128/512]
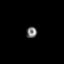
[frame 214/512]
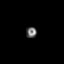
[frame 299/512]
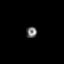
[frame 384/512]
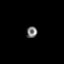
[frame 470/512]
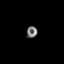

[18 of 18 positions shown; findings below may reference images not displayed]

Canned report from images found in remote index.

Refer to host system for actual result text.

## 2018-03-18 ENCOUNTER — Other Ambulatory Visit: Payer: Self-pay | Admitting: Physician Assistant

## 2018-05-07 ENCOUNTER — Other Ambulatory Visit: Payer: Self-pay | Admitting: Obstetrics and Gynecology

## 2018-05-07 DIAGNOSIS — R928 Other abnormal and inconclusive findings on diagnostic imaging of breast: Secondary | ICD-10-CM

## 2018-05-11 ENCOUNTER — Other Ambulatory Visit: Payer: Self-pay | Admitting: Obstetrics and Gynecology

## 2018-05-11 ENCOUNTER — Ambulatory Visit
Admission: RE | Admit: 2018-05-11 | Discharge: 2018-05-11 | Disposition: A | Discharge status: Home / Self Care | Payer: 59 | Source: Ambulatory Visit | Attending: Obstetrics and Gynecology | Admitting: Obstetrics and Gynecology

## 2018-05-11 DIAGNOSIS — R928 Other abnormal and inconclusive findings on diagnostic imaging of breast: Secondary | ICD-10-CM

## 2018-05-11 DIAGNOSIS — N6489 Other specified disorders of breast: Secondary | ICD-10-CM

## 2018-05-15 ENCOUNTER — Other Ambulatory Visit: Payer: Self-pay | Admitting: Obstetrics and Gynecology

## 2018-05-15 ENCOUNTER — Ambulatory Visit
Admission: RE | Admit: 2018-05-15 | Discharge: 2018-05-15 | Disposition: A | Discharge status: Home / Self Care | Payer: 59 | Source: Ambulatory Visit | Attending: Obstetrics and Gynecology | Admitting: Obstetrics and Gynecology

## 2018-05-15 DIAGNOSIS — N6489 Other specified disorders of breast: Secondary | ICD-10-CM

## 2018-05-17 ENCOUNTER — Encounter: Payer: Self-pay | Admitting: *Deleted

## 2018-05-17 ENCOUNTER — Other Ambulatory Visit: Payer: Self-pay | Admitting: Physician Assistant

## 2018-06-01 ENCOUNTER — Other Ambulatory Visit: Payer: Self-pay | Admitting: Surgery

## 2018-06-01 DIAGNOSIS — N6021 Fibroadenosis of right breast: Secondary | ICD-10-CM

## 2018-06-07 ENCOUNTER — Other Ambulatory Visit: Payer: Self-pay

## 2018-06-07 ENCOUNTER — Encounter (HOSPITAL_COMMUNITY): Payer: Self-pay | Admitting: *Deleted

## 2018-06-07 ENCOUNTER — Other Ambulatory Visit: Payer: Self-pay | Admitting: Surgery

## 2018-06-07 ENCOUNTER — Ambulatory Visit
Admission: RE | Admit: 2018-06-07 | Discharge: 2018-06-07 | Disposition: A | Discharge status: Home / Self Care | Payer: 59 | Source: Ambulatory Visit | Attending: Surgery | Admitting: Surgery

## 2018-06-07 DIAGNOSIS — N6021 Fibroadenosis of right breast: Secondary | ICD-10-CM

## 2018-06-07 NOTE — H&P (Signed)
Christina [name] Documented: 06/01/2018 11:10 AM Location: [facility] Patient #: [id number] DOB: 08-03-1966 Married / Language: English / Race: White Female   History of Present Illness (Rayquan Amrhein A. Ninfa Linden MD; 06/01/2018 11:54 AM) The patient is a 52 year old female who presents with a complaint of Breast problems. This patient was referred by Dr. Everlean Alstrom after the recent diagnosis of a complex sclerosing lesion of the right breast. She was found to have 2 suspicious areas in the right breast screening mammography. Both areas were biopsied. An area that was retroareolar and the right breast was benign. In the upper outer quadrant there was a complex sclerosing lesion. She has no previous history of breast problems. There is no family history of breast cancer. She denies nipple discharge. I performed an appendectomy on her back in 2013. She is otherwise healthy without complaints.   Past Surgical History (Tanisha A. Owens Shark, [place]; 06/01/2018 11:10 AM) Appendectomy  Breast Biopsy  Right. Oral Surgery  Tonsillectomy   Diagnostic Studies History (Tanisha A. Owens Shark, [place]; 06/01/2018 11:10 AM) Colonoscopy  1-5 years ago Mammogram  within last year Pap Smear  1-5 years ago  Allergies (Tanisha A. Owens Shark, [place]; 06/01/2018 11:11 AM) No Known Drug Allergies [06/01/2018]: Allergies Reconciled   Medication History (Tanisha A. Owens Shark, RMA; 06/01/2018 11:11 AM) RaNITidine HCl (150MG  Tablet, Oral) Active. Multi-Vitamin (Oral) Active. Medications Reconciled  Social History (Tanisha A. Owens Shark, [place]; 06/01/2018 11:10 AM) Alcohol use  Occasional alcohol use. Caffeine use  Coffee. No drug use  Tobacco use  Never smoker.  Family History (Tanisha A. Owens Shark, [place]; 06/01/2018 11:10 AM) Arthritis  Father, Mother. Heart Disease  Mother. Hypertension  Mother. Migraine Headache  Mother. Prostate Cancer  Father.  Pregnancy / Birth History (Tanisha A. Owens Shark, [place]; 06/01/2018  11:10 AM) Age at menarche  60 years. Age of menopause  27-55 Gravida  2 Irregular periods  Length (months) of breastfeeding  3-6 Maternal age  67-30 Para  2  Other Problems (Tanisha A. Owens Shark, [place]; 06/01/2018 11:10 AM) Arthritis  Migraine Headache     Review of Systems (Tanisha A. Brown RMA; 06/01/2018 11:10 AM) General Not Present- Appetite Loss, Chills, Fatigue, Fever, Night Sweats, Weight Gain and Weight Loss. Skin Not Present- Change in Wart/Mole, Dryness, Hives, Jaundice, New Lesions, Non-Healing Wounds, Rash and Ulcer. HEENT Not Present- Earache, Hearing Loss, Hoarseness, Nose Bleed, Oral Ulcers, Ringing in the Ears, Seasonal Allergies, Sinus Pain, Sore Throat, Visual Disturbances, Wears glasses/contact lenses and Yellow Eyes. Respiratory Not Present- Bloody sputum, Chronic Cough, Difficulty Breathing, Snoring and Wheezing. Breast Present- Breast Pain. Not Present- Breast Mass, Nipple Discharge and Skin Changes. Cardiovascular Not Present- Chest Pain, Difficulty Breathing Lying Down, Leg Cramps, Palpitations, Rapid Heart Rate, Shortness of Breath and Swelling of Extremities. Gastrointestinal Not Present- Abdominal Pain, Bloating, Bloody Stool, Change in Bowel Habits, Chronic diarrhea, Constipation, Difficulty Swallowing, Excessive gas, Gets full quickly at meals, Hemorrhoids, Indigestion, Nausea, Rectal Pain and Vomiting. Female Genitourinary Not Present- Frequency, Nocturia, Painful Urination, Pelvic Pain and Urgency. Musculoskeletal Present- Joint Pain. Not Present- Back Pain, Joint Stiffness, Muscle Pain, Muscle Weakness and Swelling of Extremities. Neurological Present- Headaches. Not Present- Decreased Memory, Fainting, Numbness, Seizures, Tingling, Tremor, Trouble walking and Weakness. Psychiatric Not Present- Anxiety, Bipolar, Change in Sleep Pattern, Depression, Fearful and Frequent crying. Endocrine Present- Cold Intolerance. Not Present- Excessive Hunger, Hair  Changes, Heat Intolerance, Hot flashes and New Diabetes. Hematology Not Present- Blood Thinners, Easy Bruising, Excessive bleeding, Gland problems, HIV and Persistent Infections.  Vitals Yvetta Coder  AOwens Shark RMA; 06/01/2018 11:11 AM) 06/01/2018 11:10 AM Weight: 116.2 lb Height: 62in Body Surface Area: 1.52 m Body Mass Index: 21.25 kg/m  Temp.: 98.68F  Pulse: 80 (Regular)  BP: 124/82 (Sitting, Left Arm, Standard)       Physical Exam (Golden Gilreath A. Ninfa Linden MD; 06/01/2018 11:55 AM) General Mental Status-Alert. General Appearance-Consistent with stated age. Hydration-Well hydrated. Voice-Normal.  Head and Neck Head-normocephalic, atraumatic with no lesions or palpable masses. Trachea-midline. Thyroid Gland Characteristics - normal size and consistency.  Eye Eyeball - Bilateral-Extraocular movements intact. Sclera/Conjunctiva - Bilateral-No scleral icterus.  Chest and Lung Exam Chest and lung exam reveals -quiet, even and easy respiratory effort with no use of accessory muscles and on auscultation, normal breath sounds, no adventitious sounds and normal vocal resonance. Inspection Chest Wall - Normal. Back - normal.  Breast Breast - Left-Symmetric, Non Tender, No Biopsy scars, no Dimpling, No Inflammation, No Lumpectomy scars, No Mastectomy scars, No Peau d' Orange. Breast - Right-Symmetric, Non Tender, No Biopsy scars, no Dimpling, No Inflammation, No Lumpectomy scars, No Mastectomy scars, No Peau d' Orange. Breast Lump-No Palpable Breast Mass. Note: Her biopsy incisions are well-healed. There are no palpable masses in either breast   Cardiovascular Cardiovascular examination reveals -normal heart sounds, regular rate and rhythm with no murmurs and normal pedal pulses bilaterally.  Abdomen Inspection Inspection of the abdomen reveals - No Hernias. Skin - Scar - no surgical scars. Palpation/Percussion Palpation and Percussion of the  abdomen reveal - Soft, Non Tender, No Rebound tenderness, No Rigidity (guarding) and No hepatosplenomegaly. Auscultation Auscultation of the abdomen reveals - Bowel sounds normal.  Neurologic - Did not examine.  Musculoskeletal - Did not examine.  Lymphatic Head & Neck  General Head & Neck Lymphatics: Bilateral - Description - Normal. Axillary  General Axillary Region: Bilateral - Description - Normal. Tenderness - Non Tender. Femoral & Inguinal - Did not examine.    Assessment & Plan (Sindia Kowalczyk A. Ninfa Linden MD; 06/01/2018 11:56 AM) SCLEROSING ADENOSIS OF BREAST, RIGHT (N60.21) Impression: I discussed the diagnosis with the patient and her husband. I reviewed the mammograms and I gave him a copy of the pathology results. A radioactive seed guided right breast lumpectomy is recommended to excise the complex sclerosing lesion for complete histologic evaluation to rule out malignancy. I discussed this with him in detail. She does have dense breast, we discussed getting an MRI she related deferring go ahead and schedule surgery as soon as possible. Again there is no family history of breast cancer. I discussed the surgical procedure with him in detail. I says the risk which includes but is not limited to bleeding, infection, injury to surrounding structures, the need for further surgery if malignancy is found, cardio pulmonary issues, DVT, etc. We discussed postoperative recovery. She understands and agrees to proceed.

## 2018-06-08 ENCOUNTER — Ambulatory Visit (HOSPITAL_COMMUNITY): Payer: 59 | Admitting: Anesthesiology

## 2018-06-08 ENCOUNTER — Ambulatory Visit
Admission: RE | Admit: 2018-06-08 | Discharge: 2018-06-08 | Disposition: A | Discharge status: Home / Self Care | Payer: 59 | Source: Ambulatory Visit | Attending: Surgery | Admitting: Surgery

## 2018-06-08 ENCOUNTER — Encounter (HOSPITAL_COMMUNITY): Payer: Self-pay | Admitting: *Deleted

## 2018-06-08 ENCOUNTER — Encounter (HOSPITAL_COMMUNITY)
Admission: RE | Disposition: A | Discharge status: Home / Self Care | Payer: Self-pay | Source: Ambulatory Visit | Attending: Surgery

## 2018-06-08 ENCOUNTER — Ambulatory Visit (HOSPITAL_COMMUNITY)
Admission: RE | Admit: 2018-06-08 | Discharge: 2018-06-08 | Disposition: A | Discharge status: Home / Self Care | Payer: 59 | Source: Ambulatory Visit | Attending: Surgery | Admitting: Surgery

## 2018-06-08 DIAGNOSIS — M1990 Unspecified osteoarthritis, unspecified site: Secondary | ICD-10-CM | POA: Insufficient documentation

## 2018-06-08 DIAGNOSIS — N6021 Fibroadenosis of right breast: Secondary | ICD-10-CM | POA: Diagnosis present

## 2018-06-08 DIAGNOSIS — N6011 Diffuse cystic mastopathy of right breast: Secondary | ICD-10-CM | POA: Diagnosis not present

## 2018-06-08 HISTORY — PX: BREAST LUMPECTOMY WITH RADIOACTIVE SEED LOCALIZATION: SHX6424

## 2018-06-08 LAB — CBC
HCT: 42.5 % (ref 36.0–46.0)
Hemoglobin: 14.1 g/dL (ref 12.0–15.0)
MCH: 31.3 pg (ref 26.0–34.0)
MCHC: 33.2 g/dL (ref 30.0–36.0)
MCV: 94.4 fL (ref 78.0–100.0)
Platelets: 257 10*3/uL (ref 150–400)
RBC: 4.5 MIL/uL (ref 3.87–5.11)
RDW: 12.6 % (ref 11.5–15.5)
WBC: 7.5 10*3/uL (ref 4.0–10.5)

## 2018-06-08 LAB — BASIC METABOLIC PANEL
ANION GAP: 6 (ref 5–15)
BUN: 11 mg/dL (ref 6–20)
CALCIUM: 8.7 mg/dL — AB (ref 8.9–10.3)
CO2: 24 mmol/L (ref 22–32)
Chloride: 108 mmol/L (ref 98–111)
Creatinine, Ser: 0.73 mg/dL (ref 0.44–1.00)
GLUCOSE: 100 mg/dL — AB (ref 70–99)
Potassium: 4 mmol/L (ref 3.5–5.1)
Sodium: 138 mmol/L (ref 135–145)

## 2018-06-08 LAB — POCT PREGNANCY, URINE: Preg Test, Ur: NEGATIVE

## 2018-06-08 SURGERY — BREAST LUMPECTOMY WITH RADIOACTIVE SEED LOCALIZATION
Anesthesia: General | Site: Breast | Laterality: Right

## 2018-06-08 MED ORDER — MIDAZOLAM HCL 2 MG/2ML IJ SOLN
INTRAMUSCULAR | Status: AC
  Filled 2018-06-08: qty 2

## 2018-06-08 MED ORDER — CEFAZOLIN SODIUM-DEXTROSE 2-4 GM/100ML-% IV SOLN
2.0000 g | INTRAVENOUS | Status: AC
  Administered 2018-06-08: 2 g via INTRAVENOUS
  Filled 2018-06-08: qty 100

## 2018-06-08 MED ORDER — CHLORHEXIDINE GLUCONATE CLOTH 2 % EX PADS: 6.0000 | MEDICATED_PAD | Freq: Once | CUTANEOUS | Status: DC

## 2018-06-08 MED ORDER — PROPOFOL 10 MG/ML IV BOLUS
INTRAVENOUS | Status: DC | PRN
  Administered 2018-06-08: 150 mg via INTRAVENOUS
  Administered 2018-06-08: 50 mg via INTRAVENOUS

## 2018-06-08 MED ORDER — BUPIVACAINE-EPINEPHRINE (PF) 0.25% -1:200000 IJ SOLN
INTRAMUSCULAR | Status: AC
  Filled 2018-06-08: qty 30

## 2018-06-08 MED ORDER — PROPOFOL 10 MG/ML IV BOLUS
INTRAVENOUS | Status: AC
  Filled 2018-06-08: qty 40

## 2018-06-08 MED ORDER — MIDAZOLAM HCL 5 MG/5ML IJ SOLN
INTRAMUSCULAR | Status: DC | PRN
  Administered 2018-06-08: 2 mg via INTRAVENOUS

## 2018-06-08 MED ORDER — OXYCODONE HCL 5 MG/5ML PO SOLN: 5.0000 mg | Freq: Once | ORAL | Status: DC | PRN

## 2018-06-08 MED ORDER — 0.9 % SODIUM CHLORIDE (POUR BTL) OPTIME
TOPICAL | Status: DC | PRN
  Administered 2018-06-08: 1000 mL

## 2018-06-08 MED ORDER — LACTATED RINGERS IV SOLN
INTRAVENOUS | Status: DC | PRN
  Administered 2018-06-08: 07:00:00 via INTRAVENOUS

## 2018-06-08 MED ORDER — FENTANYL CITRATE (PF) 250 MCG/5ML IJ SOLN
INTRAMUSCULAR | Status: AC
  Filled 2018-06-08: qty 5

## 2018-06-08 MED ORDER — GABAPENTIN 300 MG PO CAPS
300.0000 mg | ORAL_CAPSULE | ORAL | Status: AC
  Administered 2018-06-08: 300 mg via ORAL
  Filled 2018-06-08: qty 1

## 2018-06-08 MED ORDER — ACETAMINOPHEN 500 MG PO TABS
1000.0000 mg | ORAL_TABLET | ORAL | Status: AC
  Administered 2018-06-08: 1000 mg via ORAL
  Filled 2018-06-08: qty 2

## 2018-06-08 MED ORDER — ONDANSETRON HCL 4 MG/2ML IJ SOLN: 4.0000 mg | Freq: Once | INTRAMUSCULAR | Status: DC | PRN

## 2018-06-08 MED ORDER — OXYCODONE HCL 5 MG PO TABS: 5.0000 mg | ORAL_TABLET | Freq: Four times a day (QID) | ORAL | 0 refills | Status: DC | PRN

## 2018-06-08 MED ORDER — OXYCODONE HCL 5 MG PO TABS: 5.0000 mg | ORAL_TABLET | Freq: Once | ORAL | Status: DC | PRN

## 2018-06-08 MED ORDER — DEXAMETHASONE SODIUM PHOSPHATE 4 MG/ML IJ SOLN
INTRAMUSCULAR | Status: DC | PRN
  Administered 2018-06-08: 10 mg via INTRAVENOUS

## 2018-06-08 MED ORDER — FENTANYL CITRATE (PF) 100 MCG/2ML IJ SOLN: 25.0000 ug | INTRAMUSCULAR | Status: DC | PRN

## 2018-06-08 MED ORDER — FENTANYL CITRATE (PF) 100 MCG/2ML IJ SOLN
INTRAMUSCULAR | Status: DC | PRN
  Administered 2018-06-08: 25 ug via INTRAVENOUS

## 2018-06-08 MED ORDER — CELECOXIB 200 MG PO CAPS
200.0000 mg | ORAL_CAPSULE | ORAL | Status: AC
  Administered 2018-06-08: 200 mg via ORAL
  Filled 2018-06-08: qty 1

## 2018-06-08 MED ORDER — LIDOCAINE 2% (20 MG/ML) 5 ML SYRINGE
INTRAMUSCULAR | Status: DC | PRN
  Administered 2018-06-08: 40 mg via INTRAVENOUS

## 2018-06-08 MED ORDER — ONDANSETRON HCL 4 MG/2ML IJ SOLN
INTRAMUSCULAR | Status: DC | PRN
  Administered 2018-06-08: 4 mg via INTRAVENOUS

## 2018-06-08 MED ORDER — BUPIVACAINE-EPINEPHRINE 0.25% -1:200000 IJ SOLN
INTRAMUSCULAR | Status: DC | PRN
  Administered 2018-06-08: 10 mL

## 2018-06-08 SURGICAL SUPPLY — 45 items
ADH SKN CLS APL DERMABOND .7 (GAUZE/BANDAGES/DRESSINGS) ×1
APPLIER CLIP 9.375 MED OPEN (MISCELLANEOUS) ×3
APR CLP MED 9.3 20 MLT OPN (MISCELLANEOUS) ×1
BINDER BREAST LRG (GAUZE/BANDAGES/DRESSINGS) ×2 IMPLANT
BLADE SURG 15 STRL LF DISP TIS (BLADE) ×1 IMPLANT
BLADE SURG 15 STRL SS (BLADE) ×3
CANISTER SUCT 3000ML PPV (MISCELLANEOUS) ×3 IMPLANT
CHLORAPREP W/TINT 26ML (MISCELLANEOUS) ×3 IMPLANT
CLIP APPLIE 9.375 MED OPEN (MISCELLANEOUS) ×1 IMPLANT
CONT SPEC 4OZ CLIKSEAL STRL BL (MISCELLANEOUS) ×2 IMPLANT
COVER PROBE W GEL 5X96 (DRAPES) ×3 IMPLANT
COVER SURGICAL LIGHT HANDLE (MISCELLANEOUS) ×3 IMPLANT
DERMABOND ADVANCED (GAUZE/BANDAGES/DRESSINGS) ×2
DERMABOND ADVANCED .7 DNX12 (GAUZE/BANDAGES/DRESSINGS) ×1 IMPLANT
DEVICE DUBIN SPECIMEN MAMMOGRA (MISCELLANEOUS) ×3 IMPLANT
DRAPE CHEST BREAST 15X10 FENES (DRAPES) ×3 IMPLANT
DRAPE UTILITY XL STRL (DRAPES) ×3 IMPLANT
ELECT CAUTERY BLADE 6.4 (BLADE) ×3 IMPLANT
ELECT REM PT RETURN 9FT ADLT (ELECTROSURGICAL) ×3
ELECTRODE REM PT RTRN 9FT ADLT (ELECTROSURGICAL) ×1 IMPLANT
GLOVE BIO SURGEON STRL SZ7 (GLOVE) ×4 IMPLANT
GLOVE BIOGEL PI IND STRL 7.0 (GLOVE) IMPLANT
GLOVE BIOGEL PI IND STRL 8 (GLOVE) IMPLANT
GLOVE BIOGEL PI INDICATOR 7.0 (GLOVE) ×2
GLOVE BIOGEL PI INDICATOR 8 (GLOVE) ×2
GLOVE SURG SIGNA 7.5 PF LTX (GLOVE) ×3 IMPLANT
GOWN STRL REUS W/ TWL LRG LVL3 (GOWN DISPOSABLE) ×1 IMPLANT
GOWN STRL REUS W/ TWL XL LVL3 (GOWN DISPOSABLE) ×1 IMPLANT
GOWN STRL REUS W/TWL LRG LVL3 (GOWN DISPOSABLE) ×3
GOWN STRL REUS W/TWL XL LVL3 (GOWN DISPOSABLE) ×3
KIT BASIN OR (CUSTOM PROCEDURE TRAY) ×3 IMPLANT
KIT MARKER MARGIN INK (KITS) ×3 IMPLANT
NDL HYPO 25GX1X1/2 BEV (NEEDLE) ×1 IMPLANT
NEEDLE HYPO 25GX1X1/2 BEV (NEEDLE) ×3 IMPLANT
NS IRRIG 1000ML POUR BTL (IV SOLUTION) ×2 IMPLANT
PACK SURGICAL SETUP 50X90 (CUSTOM PROCEDURE TRAY) ×3 IMPLANT
PENCIL BUTTON HOLSTER BLD 10FT (ELECTRODE) ×3 IMPLANT
SPONGE LAP 18X18 X RAY DECT (DISPOSABLE) ×3 IMPLANT
SUT MNCRL AB 4-0 PS2 18 (SUTURE) ×3 IMPLANT
SUT VIC AB 3-0 SH 18 (SUTURE) ×3 IMPLANT
SYR BULB 3OZ (MISCELLANEOUS) ×3 IMPLANT
SYR CONTROL 10ML LL (SYRINGE) ×3 IMPLANT
TUBE CONNECTING 12'X1/4 (SUCTIONS) ×1
TUBE CONNECTING 12X1/4 (SUCTIONS) ×2 IMPLANT
YANKAUER SUCT BULB TIP NO VENT (SUCTIONS) ×3 IMPLANT

## 2018-06-08 NOTE — Anesthesia Preprocedure Evaluation (Addendum)
Anesthesia Evaluation  Patient identified by MRN, date of birth, ID band Patient awake    Reviewed: Allergy & Precautions, NPO status , Patient's Chart, lab work & pertinent test results  Airway Mallampati: II  TM Distance: >3 FB Neck ROM: Full    Dental  (+) Teeth Intact, Dental Advisory Given   Pulmonary    breath sounds clear to auscultation       Cardiovascular  Rhythm:Regular     Neuro/Psych    GI/Hepatic   Endo/Other    Renal/GU      Musculoskeletal   Abdominal   Peds  Hematology   Anesthesia Other Findings   Reproductive/Obstetrics                            Anesthesia Physical Anesthesia Plan  ASA: II  Anesthesia Plan: General   Post-op Pain Management:    Induction: Intravenous  PONV Risk Score and Plan: Ondansetron and Dexamethasone  Airway Management Planned: LMA  Additional Equipment:   Intra-op Plan:   Post-operative Plan:   Informed Consent: I have reviewed the patients History and Physical, chart, labs and discussed the procedure including the risks, benefits and alternatives for the proposed anesthesia with the patient or authorized representative who has indicated his/her understanding and acceptance.   Dental advisory given  Plan Discussed with: CRNA and Anesthesiologist  Anesthesia Plan Comments:        Anesthesia Quick Evaluation

## 2018-06-08 NOTE — Transfer of Care (Signed)
Immediate Anesthesia Transfer of Care Note  Patient: Christina [name]  Procedure(s) Performed: RIGHT BREAST LUMPECTOMY WITH RADIOACTIVE SEED LOCALIZATION (Right Breast)  Patient Location: PACU  Anesthesia Type:General  Level of Consciousness: awake, alert  and oriented  Airway & Oxygen Therapy: Patient Spontanous Breathing and Patient connected to nasal cannula oxygen  Post-op Assessment: Report given to RN and Post -op Vital signs reviewed and stable  Post vital signs: Reviewed and stable  Last Vitals:  Vitals Value Taken Time  BP 125/75 06/08/2018  8:25 AM  Temp    Pulse 117 06/08/2018  8:25 AM  Resp 11 06/08/2018  8:25 AM  SpO2 100 % 06/08/2018  8:25 AM  Vitals shown include unvalidated device data.  Last Pain:  Vitals:   06/08/18 0605  TempSrc:   PainSc: 0-No pain      Patients Stated Pain Goal: 7 (33/29/51 [id number])  Complications: No apparent anesthesia complications

## 2018-06-08 NOTE — Discharge Instructions (Signed)
[place] Office Phone Number [phone]  BREAST BIOPSY/ PARTIAL MASTECTOMY: POST OP INSTRUCTIONS  Always review your discharge instruction sheet given to you by the facility where your surgery was performed.  IF YOU HAVE DISABILITY OR FAMILY LEAVE FORMS, YOU MUST BRING THEM TO THE OFFICE FOR PROCESSING.  DO NOT GIVE THEM TO YOUR DOCTOR.  1. A prescription for pain medication may be given to you upon discharge.  Take your pain medication as prescribed, if needed.  If narcotic pain medicine is not needed, then you may take acetaminophen (Tylenol) or ibuprofen (Advil) as needed. 2. Take your usually prescribed medications unless otherwise directed 3. If you need a refill on your pain medication, please contact your pharmacy.  They will contact our office to request authorization.  Prescriptions will not be filled after 5pm or on week-ends. 4. You should eat very light the first 24 hours after surgery, such as soup, crackers, pudding, etc.  Resume your normal diet the day after surgery. 5. Most patients will experience some swelling and bruising in the breast.  Ice packs and a good support bra will help.  Swelling and bruising can take several days to resolve.  6. It is common to experience some constipation if taking pain medication after surgery.  Increasing fluid intake and taking a stool softener will usually help or prevent this problem from occurring.  A mild laxative (Milk of Magnesia or Miralax) should be taken according to package directions if there are no bowel movements after 48 hours. 7. Unless discharge instructions indicate otherwise, you may remove your bandages 24-48 hours after surgery, and you may shower at that time.  You may have steri-strips (small skin tapes) in place directly over the incision.  These strips should be left on the skin for 7-10 days.  If your surgeon used skin glue on the incision, you may shower in 24 hours.  The glue will flake off over the  next 2-3 weeks.  Any sutures or staples will be removed at the office during your follow-up visit. 8. ACTIVITIES:  You may resume regular daily activities (gradually increasing) beginning the next day.  Wearing a good support bra or sports bra minimizes pain and swelling.  You may have sexual intercourse when it is comfortable. a. You may drive when you no longer are taking prescription pain medication, you can comfortably wear a seatbelt, and you can safely maneuver your car and apply brakes. b. RETURN TO WORK:  ______________________________________________________________________________________ 9. You should see your doctor in the office for a follow-up appointment approximately two weeks after your surgery.  Your doctors nurse will typically make your follow-up appointment when she calls you with your pathology report.  Expect your pathology report 2-3 business days after your surgery.  You may call to check if you do not hear from [place] after three days. 10. OTHER INSTRUCTIONS: __OK TO SHOWER STARTING TOMORROW 11. ICE PACK, IBUPROFEN, TYLENOL ALSO FOR PAIN 12. _____________________________________________________________________________________________ _____________________________________________________________________________________________________________________________________ _____________________________________________________________________________________________________________________________________ _____________________________________________________________________________________________________________________________________  WHEN TO CALL YOUR DOCTOR: 1. Fever over 101.0 2. Nausea and/or vomiting. 3. Extreme swelling or bruising. 4. Continued bleeding from incision. 5. Increased pain, redness, or drainage from the incision.  The clinic staff is available to answer your questions during regular business hours.  Please dont hesitate to call and ask to speak to one of the  nurses for clinical concerns.  If you have a medical emergency, go to the nearest emergency room or call 911.  A surgeon from [facility] is  always on call at the hospital. ° °For further questions, please visit [email or web address]  °

## 2018-06-08 NOTE — Op Note (Signed)
RIGHT BREAST LUMPECTOMY WITH RADIOACTIVE SEED LOCALIZATION  Procedure Note  Christina [name] 06/08/2018   Pre-op Diagnosis: RIGHT BREAST COMPLEX SCLEROSING LESION     Post-op Diagnosis: same  Procedure(s): RIGHT BREAST LUMPECTOMY WITH RADIOACTIVE SEED LOCALIZATION  Surgeon(s): Coralie Keens, MD  Anesthesia: General  Staff:  Circulator: Rozell Searing, RN Scrub Person: Rolan Bucco Circulator Assistant: Beryle Lathe, RN  Estimated Blood Loss: Minimal               Specimens: sent to path  Indications: This is [name] 52 year old female who had undergone [name] screening mammography.  2 suspicious areas were seen in the right breast.  Both were biopsied.  One showed [name] complex sclerosing lesion and the other when showed fibrocystic changes.  The decision was made to proceed to the operating room for [name] radioactive seed guided lumpectomy to remove the complex sclerosing lesion  Findings: The lumpectomy specimen contained the radioactive seed and previous marker.  The seed fell out of the specimen and was x-rayed separately.  I removed some tissue anterior to this and it also contained the other biopsy clip.  Procedure: The patient was brought to the operating room and identified as correct patient.  She was placed supine on the operating table and general anesthesia was induced.  Her right breast was prepped and draped in usual sterile fashion.  I localized the radioactive seed with the neoprobe in the upper outer quadrant of the breast near the areola.  I anesthetized the edge of the areola with Marcaine.  I then made [name] circumareolar incision with [name] scalpel.  I then dissected down to the breast tissue with electrocautery.  The radioactive seed was moderately deep in the breast.  While performing the lumpectomy around the seed the seed actually fell out of the tissue and was removed and placed in [name] cup.  I then completed the lumpectomy of this deep tissue.  I also removed some breast  tissue anterior to this.  I marked the lumpectomy specimen that had had the radioactive seed in place with marker paint.  Both specimens were then x-rayed and both of the previously placed biopsy markers were in these specimen.  These were then both sent to pathology for evaluation.  I achieved hemostasis with the cautery.  I placed the surgical clip in the deep biopsy cavity with [name] complex sclerosing lesion had been removed.  I then anesthetized the wound further with Marcaine.  I closed the subcutaneous tissue with interrupted 3-0 Vicryl sutures and closed the skin with [name] running 4-0 Monocryl.  Dermabond was then applied.  The patient tolerated the procedure well.  All the counts were correct at the end of the procedure.  The patient was then extubated in the operating room and taken in [name] stable condition to the recovery room.          Christina [name]   Date: 06/08/2018  Time: 8:13 AM

## 2018-06-08 NOTE — Interval H&P Note (Signed)
History and Physical Interval Note: no change in H and P   06/08/2018 7:05 AM  Christina [name]  has presented today for surgery, with the diagnosis of RIGHT BREAST COMPLEX SCLEROSING LESION  The various methods of treatment have been discussed with the patient and family. After consideration of risks, benefits and other options for treatment, the patient has consented to  Procedure(s): BREAST LUMPECTOMY WITH RADIOACTIVE SEED LOCALIZATION (Right) as a surgical intervention .  The patient's history has been reviewed, patient examined, no change in status, stable for surgery.  I have reviewed the patient's chart and labs.  Questions were answered to the patient's satisfaction.     Dorsel Flinn A

## 2018-06-08 NOTE — Anesthesia Procedure Notes (Signed)
Procedure Name: LMA Insertion Date/Time: 06/08/2018 7:37 AM Performed by: Candis Shine, CRNA Pre-anesthesia Checklist: Patient identified, Emergency Drugs available, Suction available and Patient being monitored Patient Re-evaluated:Patient Re-evaluated prior to induction Oxygen Delivery Method: Circle System Utilized Preoxygenation: Pre-oxygenation with 100% oxygen Induction Type: IV induction Ventilation: Mask ventilation without difficulty LMA: LMA inserted LMA Size: 3.0 Number of attempts: 1 Placement Confirmation: positive ETCO2 Tube secured with: Tape Dental Injury: Teeth and Oropharynx as per pre-operative assessment

## 2018-06-12 ENCOUNTER — Encounter (HOSPITAL_COMMUNITY): Payer: Self-pay | Admitting: Surgery

## 2018-06-12 NOTE — Anesthesia Postprocedure Evaluation (Signed)
Anesthesia Post Note  Patient: Christina [name]  Procedure(s) Performed: RIGHT BREAST LUMPECTOMY WITH RADIOACTIVE SEED LOCALIZATION (Right Breast)     Patient location during evaluation: PACU Anesthesia Type: General Level of consciousness: awake and alert Pain management: pain level controlled Vital Signs Assessment: post-procedure vital signs reviewed and stable Respiratory status: spontaneous breathing, nonlabored ventilation, respiratory function stable and patient connected to nasal cannula oxygen Cardiovascular status: blood pressure returned to baseline and stable Postop Assessment: no apparent nausea or vomiting Anesthetic complications: no    Last Vitals:  Vitals:   06/08/18 0903 06/08/18 0904  BP:  118/64  Pulse: 76   Resp:    Temp:    SpO2: 100%     Last Pain:  Vitals:   06/08/18 0830  TempSrc:   PainSc: 0-No pain                 Dixie Coppa COKER

## 2018-06-18 ENCOUNTER — Other Ambulatory Visit: Payer: Self-pay | Admitting: Surgery

## 2018-07-14 ENCOUNTER — Other Ambulatory Visit: Payer: Self-pay | Admitting: Physician Assistant

## 2018-10-18 DIAGNOSIS — D2261 Melanocytic nevi of right upper limb, including shoulder: Secondary | ICD-10-CM | POA: Diagnosis not present

## 2018-10-18 DIAGNOSIS — D2262 Melanocytic nevi of left upper limb, including shoulder: Secondary | ICD-10-CM | POA: Diagnosis not present

## 2018-10-18 DIAGNOSIS — D2272 Melanocytic nevi of left lower limb, including hip: Secondary | ICD-10-CM | POA: Diagnosis not present

## 2018-10-18 DIAGNOSIS — D225 Melanocytic nevi of trunk: Secondary | ICD-10-CM | POA: Diagnosis not present

## 2018-10-18 DIAGNOSIS — L905 Scar conditions and fibrosis of skin: Secondary | ICD-10-CM | POA: Diagnosis not present

## 2018-10-18 DIAGNOSIS — L814 Other melanin hyperpigmentation: Secondary | ICD-10-CM | POA: Diagnosis not present

## 2018-10-18 DIAGNOSIS — D485 Neoplasm of uncertain behavior of skin: Secondary | ICD-10-CM | POA: Diagnosis not present

## 2018-12-20 DIAGNOSIS — R5383 Other fatigue: Secondary | ICD-10-CM | POA: Diagnosis not present

## 2018-12-20 DIAGNOSIS — R194 Change in bowel habit: Secondary | ICD-10-CM | POA: Diagnosis not present

## 2018-12-20 DIAGNOSIS — D509 Iron deficiency anemia, unspecified: Secondary | ICD-10-CM | POA: Diagnosis not present

## 2019-03-20 ENCOUNTER — Encounter: Payer: Self-pay | Admitting: Obstetrics and Gynecology

## 2019-05-08 DIAGNOSIS — Z1231 Encounter for screening mammogram for malignant neoplasm of breast: Secondary | ICD-10-CM | POA: Diagnosis not present

## 2019-05-15 DIAGNOSIS — Z780 Asymptomatic menopausal state: Secondary | ICD-10-CM | POA: Diagnosis not present

## 2019-05-15 DIAGNOSIS — Z01419 Encounter for gynecological examination (general) (routine) without abnormal findings: Secondary | ICD-10-CM | POA: Diagnosis not present

## 2019-05-15 DIAGNOSIS — Z6821 Body mass index (BMI) 21.0-21.9, adult: Secondary | ICD-10-CM | POA: Diagnosis not present

## 2019-06-03 DIAGNOSIS — N912 Amenorrhea, unspecified: Secondary | ICD-10-CM | POA: Diagnosis not present

## 2019-06-04 DIAGNOSIS — N858 Other specified noninflammatory disorders of uterus: Secondary | ICD-10-CM | POA: Diagnosis not present

## 2019-08-30 ENCOUNTER — Other Ambulatory Visit: Payer: Self-pay

## 2019-08-30 ENCOUNTER — Ambulatory Visit (INDEPENDENT_AMBULATORY_CARE_PROVIDER_SITE_OTHER): Payer: BC Managed Care – PPO | Admitting: Family Medicine

## 2019-08-30 ENCOUNTER — Encounter: Payer: Self-pay | Admitting: Family Medicine

## 2019-08-30 VITALS — BP 120/76 | HR 71 | Ht 62.0 in | Wt 112.2 lb

## 2019-08-30 DIAGNOSIS — S161XXA Strain of muscle, fascia and tendon at neck level, initial encounter: Secondary | ICD-10-CM
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
## 2019-08-30 NOTE — Patient Instructions (Signed)
Thank you for coming in today. Use a heating pad and consider TENS unit.  Resume activity as tolerated.  If not getting better let me know and I will set up physical therapy.   Come back or go to the emergency room if you notice new weakness new numbness problems walking or bowel or bladder problems.

## 2020-02-17 DIAGNOSIS — R590 Localized enlarged lymph nodes: Secondary | ICD-10-CM | POA: Diagnosis not present

## 2020-02-24 DIAGNOSIS — N6331 Unspecified lump in axillary tail of the right breast: Secondary | ICD-10-CM | POA: Diagnosis not present

## 2020-02-25 ENCOUNTER — Other Ambulatory Visit: Payer: Self-pay | Admitting: Obstetrics and Gynecology

## 2020-02-25 DIAGNOSIS — N6331 Unspecified lump in axillary tail of the right breast: Secondary | ICD-10-CM

## 2020-02-28 ENCOUNTER — Other Ambulatory Visit: Payer: Self-pay | Admitting: Obstetrics and Gynecology

## 2020-02-28 ENCOUNTER — Ambulatory Visit
Admission: RE | Admit: 2020-02-28 | Discharge: 2020-02-28 | Disposition: A | Discharge status: Home / Self Care | Payer: BC Managed Care – PPO | Source: Ambulatory Visit | Attending: Obstetrics and Gynecology | Admitting: Obstetrics and Gynecology

## 2020-02-28 ENCOUNTER — Other Ambulatory Visit: Payer: Self-pay

## 2020-02-28 DIAGNOSIS — N6331 Unspecified lump in axillary tail of the right breast: Secondary | ICD-10-CM

## 2020-02-28 DIAGNOSIS — N6489 Other specified disorders of breast: Secondary | ICD-10-CM | POA: Diagnosis not present

## 2020-02-28 DIAGNOSIS — R922 Inconclusive mammogram: Secondary | ICD-10-CM | POA: Diagnosis not present

## 2020-03-04 ENCOUNTER — Other Ambulatory Visit: Payer: Self-pay | Admitting: Obstetrics and Gynecology

## 2020-03-04 DIAGNOSIS — Z1231 Encounter for screening mammogram for malignant neoplasm of breast: Secondary | ICD-10-CM

## 2020-03-05 ENCOUNTER — Ambulatory Visit (INDEPENDENT_AMBULATORY_CARE_PROVIDER_SITE_OTHER): Payer: BC Managed Care – PPO | Admitting: Family Medicine

## 2020-03-05 ENCOUNTER — Other Ambulatory Visit: Payer: Self-pay

## 2020-03-05 ENCOUNTER — Encounter: Payer: Self-pay | Admitting: Family Medicine

## 2020-03-05 ENCOUNTER — Other Ambulatory Visit: Payer: BC Managed Care – PPO

## 2020-03-05 VITALS — BP 98/68 | HR 68 | Temp 98.0°F | Wt 114.0 lb

## 2020-03-05 DIAGNOSIS — R195 Other fecal abnormalities: Secondary | ICD-10-CM | POA: Diagnosis not present

## 2020-03-05 DIAGNOSIS — R1013 Epigastric pain: Secondary | ICD-10-CM | POA: Diagnosis not present

## 2020-03-05 DIAGNOSIS — R140 Abdominal distension (gaseous): Secondary | ICD-10-CM

## 2020-03-05 NOTE — Patient Instructions (Signed)
Abdominal Pain, Adult Pain in the abdomen (abdominal pain) can be caused by many things. Often, abdominal pain is not serious and it gets better with no treatment or by being treated at home. However, sometimes abdominal pain is serious. Your health care provider will ask questions about your medical history and do a physical exam to try to determine the cause of your abdominal pain. Follow these instructions at home:  Medicines  Take over-the-counter and prescription medicines only as told by your health care provider.  Do not take a laxative unless told by your health care provider. General instructions  Watch your condition for any changes.  Drink enough fluid to keep your urine pale yellow.  Keep all follow-up visits as told by your health care provider. This is important. Contact a health care provider if:  Your abdominal pain changes or gets worse.  You are not hungry or you lose weight without trying.  You are constipated or have diarrhea for more than 2-3 days.  You have pain when you urinate or have a bowel movement.  Your abdominal pain wakes you up at night.  Your pain gets worse with meals, after eating, or with certain foods.  You are vomiting and cannot keep anything down.  You have a fever.  You have blood in your urine. Get help right away if:  Your pain does not go away as soon as your health care provider told you to expect.  You cannot stop vomiting.  Your pain is only in areas of the abdomen, such as the right side or the left lower portion of the abdomen. Pain on the right side could be caused by appendicitis.  You have bloody or black stools, or stools that look like tar.  You have severe pain, cramping, or bloating in your abdomen.  You have signs of dehydration, such as: ? Dark urine, very little urine, or no urine. ? Cracked lips. ? Dry mouth. ? Sunken eyes. ? Sleepiness. ? Weakness.  You have trouble breathing or chest  pain. Summary  Often, abdominal pain is not serious and it gets better with no treatment or by being treated at home. However, sometimes abdominal pain is serious.  Watch your condition for any changes.  Take over-the-counter and prescription medicines only as told by your health care provider.  Contact a health care provider if your abdominal pain changes or gets worse.  Get help right away if you have severe pain, cramping, or bloating in your abdomen. This information is not intended to replace advice given to you by your health care provider. Make sure you discuss any questions you have with your health care provider. Document Revised: 02/04/2019 Document Reviewed: 02/04/2019 Elsevier Patient Education  [place].  Abdominal Bloating When you have abdominal bloating, your abdomen may feel full, tight, or painful. It may also look bigger than normal or swollen (distended). Common causes of abdominal bloating include:  Swallowing air.  Constipation.  Problems digesting food.  Eating too much.  Irritable bowel syndrome. This is a condition that affects the large intestine.  Lactose intolerance. This is an inability to digest lactose, a natural sugar in dairy products.  Celiac disease. This is a condition that affects the ability to digest gluten, a protein found in some grains.  Gastroparesis. This is a condition that slows down the movement of food in the stomach and small intestine. It is more common in people with diabetes mellitus.  Gastroesophageal reflux disease (GERD). This is a  digestive condition that makes stomach acid flow back into the esophagus.  Urinary retention. This means that the body is holding onto urine, and the bladder cannot be emptied all the way. Follow these instructions at home: Eating and drinking  Avoid eating too much.  Try not to swallow air while talking or eating.  Avoid eating while lying down.  Avoid these foods and  drinks: ? Foods that cause gas, such as broccoli, cabbage, cauliflower, and baked beans. ? Carbonated drinks. ? Hard candy. ? Chewing gum. Medicines  Take over-the-counter and prescription medicines only as told by your health care provider.  Take probiotic medicines. These medicines contain live bacteria or yeasts that can help digestion.  Take coated peppermint oil capsules. Activity  Try to exercise regularly. Exercise may help to relieve bloating that is caused by gas and relieve constipation. General instructions  Keep all follow-up visits as told by your health care provider. This is important. Contact a health care provider if:  You have nausea and vomiting.  You have diarrhea.  You have abdominal pain.  You have unusual weight loss or weight gain.  You have severe pain, and medicines do not help. Get help right away if:  You have severe chest pain.  You have trouble breathing.  You have shortness of breath.  You have trouble urinating.  You have darker urine than normal.  You have blood in your stools or have dark, tarry stools. Summary  Abdominal bloating means that the abdomen is swollen.  Common causes of abdominal bloating are swallowing air, constipation, and problems digesting food.  Avoid eating too much and avoid swallowing air.  Avoid foods that cause gas, carbonated drinks, hard candy, and chewing gum. This information is not intended to replace advice given to you by your health care provider. Make sure you discuss any questions you have with your health care provider. Document Revised: 01/14/2019 Document Reviewed: 10/28/2016 Elsevier Patient Education  [place].

## 2020-03-05 NOTE — Progress Notes (Signed)
Subjective:    Patient ID: Christina [name], female    DOB: 12-06-1965, 54 y.o.   MRN: [id number]  No chief complaint on file.   HPI Pt is a 54 yo female with pmh sig for TMJ.  Pt seen today in an acute visit for an ongoing concern.  Pt's pcp is Inda Coke, [place].  Pt endorses current episode of abdominal tenderness, loose bowels, bloating x3 days.  Pt notes previous episodes of similar symptoms several months ago.  Pt endorses loose stools with a greasy film after eating any food, fullness in epigastric area, nausea, and low-grade temp of 35F.  Pt denies changes in diet or anyone at home.  Past Medical History:  Diagnosis Date  . Anxiety   . Generalized headaches    Migraine     No Known Allergies  ROS General: Denies fever, chills, night sweats, changes in weight, changes in appetite HEENT: Denies headaches, ear pain, changes in vision, rhinorrhea, sore throat CV: Denies CP, palpitations, SOB, orthopnea Pulm: Denies SOB, cough, wheezing GI: Denies vomiting, constipation  + loose stools, epigastric abdominal pain, bloating, abdominal cramping, nausea GU: Denies dysuria, hematuria, frequency, vaginal discharge Msk: Denies muscle cramps, joint pains Neuro: Denies weakness, numbness, tingling Skin: Denies rashes, bruising Psych: Denies depression, anxiety, hallucinations     Objective:    Blood pressure 98/68, pulse 68, temperature 98 F (36.7 C), temperature source Temporal, weight 114 lb (51.7 kg), SpO2 97 %.   Gen. Pleasant, well-nourished, in no distress, normal affect  HEENT: [place]/AT, face symmetric, conjunctiva clear, no scleral icterus, PERRLA, EOMI, nares patent without drainage Lungs: no accessory muscle use, CTAB, no wheezes or rales Cardiovascular: RRR, no m/r/g, n eperipheral edema Abdomen: BS present, soft, diffuse tenderness, negative Murphy's sign, ND, no hepatosplenomegaly. Neuro:  A&Ox3, CN II-XII intact, normal gait Skin:  Warm, no lesions/ rash   Wt Readings  from Last 3 Encounters:  03/05/20 114 lb (51.7 kg)  08/30/19 112 lb 3.2 oz (50.9 kg)  06/08/18 116 lb (52.6 kg)    Lab Results  Component Value Date   WBC 7.5 06/08/2018   HGB 14.1 06/08/2018   HCT 42.5 06/08/2018   PLT 257 06/08/2018   GLUCOSE 100 (H) 06/08/2018   CHOL 197 04/25/2017   TRIG 86 04/25/2017   HDL 58 04/25/2017   LDLCALC 122 (H) 04/25/2017   ALT 7 07/18/2017   AST 13 07/18/2017   NA 138 06/08/2018   K 4.0 06/08/2018   CL 108 06/08/2018   CREATININE 0.73 06/08/2018   BUN 11 06/08/2018   CO2 24 06/08/2018   TSH 2.320 04/25/2017   INR 1.08 02/16/2012    Assessment/Plan:  Epigastric abdominal pain  -Discussed possible causes including cholecystitis, gastric ulcer, viral or bacterial enteritis -will obtain labs -Consider imaging such as RUQ ultrasound based on labs. -Consider follow-up with GI for continued symptoms. -Given precautions - Plan: Comprehensive metabolic panel, CBC with Differential/Platelet, Lipase  Loose stools  -Discussed various causes -Consider probiotic, bland diet -Given handout -Given precautions - Plan: CBC with Differential/Platelet  Bloating -Consider probiotic -Given precautions -Consider follow-up with GI  F/u with pcp/[facility] office.   Grier Mitts, MD

## 2020-03-06 ENCOUNTER — Telehealth: Payer: Self-pay | Admitting: Physician Assistant

## 2020-03-06 ENCOUNTER — Other Ambulatory Visit: Payer: BC Managed Care – PPO

## 2020-03-06 ENCOUNTER — Other Ambulatory Visit: Payer: Self-pay | Admitting: Family Medicine

## 2020-03-06 DIAGNOSIS — R748 Abnormal levels of other serum enzymes: Secondary | ICD-10-CM

## 2020-03-06 LAB — COMPREHENSIVE METABOLIC PANEL
ALT: 10 U/L (ref 0–35)
AST: 16 U/L (ref 0–37)
Albumin: 4.6 g/dL (ref 3.5–5.2)
Alkaline Phosphatase: 64 U/L (ref 39–117)
BUN: 13 mg/dL (ref 6–23)
CO2: 29 mEq/L (ref 19–32)
Calcium: 9.5 mg/dL (ref 8.4–10.5)
Chloride: 102 mEq/L (ref 96–112)
Creatinine, Ser: 0.68 mg/dL (ref 0.40–1.20)
GFR: 89.62 mL/min (ref >60.00)
Glucose, Bld: 75 mg/dL (ref 70–99)
Potassium: 4.3 mEq/L (ref 3.5–5.1)
Sodium: 140 mEq/L (ref 135–145)
Total Bilirubin: 0.3 mg/dL (ref 0.2–1.2)
Total Protein: 6.7 g/dL (ref 6.0–8.3)

## 2020-03-06 LAB — CBC WITH DIFFERENTIAL/PLATELET
Basophils Absolute: 0 10*3/uL (ref 0.0–0.1)
Basophils Relative: 0.6 % (ref 0.0–3.0)
Eosinophils Absolute: 0.2 10*3/uL (ref 0.0–0.7)
Eosinophils Relative: 2.1 % (ref 0.0–5.0)
HCT: 43.7 % (ref 36.0–46.0)
Hemoglobin: 14.7 g/dL (ref 12.0–15.0)
Lymphocytes Relative: 33.2 % (ref 12.0–46.0)
Lymphs Abs: 2.6 10*3/uL (ref 0.7–4.0)
MCHC: 33.5 g/dL (ref 30.0–36.0)
MCV: 93.8 fl (ref 78.0–100.0)
Monocytes Absolute: 0.7 10*3/uL (ref 0.1–1.0)
Monocytes Relative: 9.1 % (ref 3.0–12.0)
Neutro Abs: 4.2 10*3/uL (ref 1.4–7.7)
Neutrophils Relative %: 55 % (ref 43.0–77.0)
Platelets: 279 10*3/uL (ref 150.0–400.0)
RBC: 4.66 Mil/uL (ref 3.87–5.11)
RDW: 12.9 % (ref 11.5–15.5)
WBC: 7.7 10*3/uL (ref 4.0–10.5)

## 2020-03-06 LAB — LIPASE: Lipase: 70 U/L — ABNORMAL HIGH (ref 11.0–59.0)

## 2020-03-06 NOTE — Telephone Encounter (Signed)
Spoke with pt advised that when Dr Volanda Napoleon reviews lab results pt will be notified

## 2020-03-06 NOTE — Telephone Encounter (Signed)
Reviewed pt lab results and recommendations, verbalized understanding

## 2020-03-06 NOTE — Telephone Encounter (Signed)
Pt saw Dr Volanda Napoleon on Yesterday calling for her lab results

## 2020-03-06 NOTE — Telephone Encounter (Signed)
Tried to call pt back no answer and no way to leave a message on pt phone, will try later

## 2020-03-06 NOTE — Telephone Encounter (Signed)
Pt is calling in stating that she would like to receive her lab results and stated that she has never been called.  Pt is aware that as soon as the doctor results the labs someone from the office will give her a call.

## 2020-03-08 ENCOUNTER — Other Ambulatory Visit: Payer: Self-pay

## 2020-03-08 ENCOUNTER — Emergency Department (HOSPITAL_COMMUNITY): Payer: BC Managed Care – PPO

## 2020-03-08 ENCOUNTER — Encounter (HOSPITAL_COMMUNITY): Payer: Self-pay | Admitting: Emergency Medicine

## 2020-03-08 ENCOUNTER — Emergency Department (HOSPITAL_COMMUNITY)
Admission: EM | Admit: 2020-03-08 | Discharge: 2020-03-08 | Disposition: A | Discharge status: Home / Self Care | Payer: BC Managed Care – PPO | Attending: Emergency Medicine | Admitting: Emergency Medicine

## 2020-03-08 DIAGNOSIS — R509 Fever, unspecified: Secondary | ICD-10-CM | POA: Insufficient documentation

## 2020-03-08 DIAGNOSIS — R1084 Generalized abdominal pain: Secondary | ICD-10-CM | POA: Diagnosis not present

## 2020-03-08 DIAGNOSIS — R110 Nausea: Secondary | ICD-10-CM | POA: Insufficient documentation

## 2020-03-08 DIAGNOSIS — R197 Diarrhea, unspecified: Secondary | ICD-10-CM | POA: Diagnosis not present

## 2020-03-08 LAB — URINALYSIS, ROUTINE W REFLEX MICROSCOPIC
Bilirubin Urine: NEGATIVE
Glucose, UA: NEGATIVE mg/dL
Hgb urine dipstick: NEGATIVE
Ketones, ur: NEGATIVE mg/dL
Nitrite: NEGATIVE
Protein, ur: NEGATIVE mg/dL
Specific Gravity, Urine: 1.015 (ref 1.005–1.030)
pH: 6 (ref 5.0–8.0)

## 2020-03-08 LAB — CBC
HCT: 44 % (ref 36.0–46.0)
Hemoglobin: 14.8 g/dL (ref 12.0–15.0)
MCH: 31.2 pg (ref 26.0–34.0)
MCHC: 33.6 g/dL (ref 30.0–36.0)
MCV: 92.8 fL (ref 80.0–100.0)
Platelets: 261 10*3/uL (ref 150–400)
RBC: 4.74 MIL/uL (ref 3.87–5.11)
RDW: 11.9 % (ref 11.5–15.5)
WBC: 9.6 10*3/uL (ref 4.0–10.5)
nRBC: 0 % (ref 0.0–0.2)

## 2020-03-08 LAB — COMPREHENSIVE METABOLIC PANEL
ALT: 13 U/L (ref 0–44)
AST: 18 U/L (ref 15–41)
Albumin: 4.5 g/dL (ref 3.5–5.0)
Alkaline Phosphatase: 57 U/L (ref 38–126)
Anion gap: 6 (ref 5–15)
BUN: 13 mg/dL (ref 6–20)
CO2: 26 mmol/L (ref 22–32)
Calcium: 9.1 mg/dL (ref 8.9–10.3)
Chloride: 107 mmol/L (ref 98–111)
Creatinine, Ser: 0.53 mg/dL (ref 0.44–1.00)
GFR calc Af Amer: >60 mL/min (ref >60)
GFR calc non Af Amer: >60 mL/min (ref >60)
Glucose, Bld: 93 mg/dL (ref 70–99)
Potassium: 3.8 mmol/L (ref 3.5–5.1)
Sodium: 139 mmol/L (ref 135–145)
Total Bilirubin: 0.8 mg/dL (ref 0.3–1.2)
Total Protein: 7 g/dL (ref 6.5–8.1)

## 2020-03-08 LAB — I-STAT BETA HCG BLOOD, ED (MC, WL, AP ONLY): I-stat hCG, quantitative: <5 m[IU]/mL (ref <5)

## 2020-03-08 LAB — LIPASE, BLOOD: Lipase: 38 U/L (ref 11–51)

## 2020-03-08 MED ORDER — SODIUM CHLORIDE (PF) 0.9 % IJ SOLN
INTRAMUSCULAR | Status: AC
  Filled 2020-03-08: qty 50

## 2020-03-08 MED ORDER — IOHEXOL 9 MG/ML PO SOLN
ORAL | Status: AC
  Filled 2020-03-08: qty 1000

## 2020-03-08 MED ORDER — IOHEXOL 300 MG/ML  SOLN
100.0000 mL | Freq: Once | INTRAMUSCULAR | Status: AC | PRN
  Administered 2020-03-08: 100 mL via INTRAVENOUS

## 2020-03-08 MED ORDER — FAMOTIDINE 20 MG PO TABS
20.0000 mg | ORAL_TABLET | Freq: Two times a day (BID) | ORAL | 0 refills | Status: DC
Start: 2020-03-08 — End: 2020-03-23

## 2020-03-08 MED ORDER — ONDANSETRON 4 MG PO TBDP
4.0000 mg | ORAL_TABLET | Freq: Three times a day (TID) | ORAL | 0 refills | Status: DC | PRN
Start: 2020-03-08 — End: 2020-04-30

## 2020-03-08 MED ORDER — IOHEXOL 9 MG/ML PO SOLN: 500.0000 mL | ORAL | Status: AC

## 2020-03-08 MED ORDER — SODIUM CHLORIDE 0.9 % IV BOLUS
1000.0000 mL | Freq: Once | INTRAVENOUS | Status: AC
  Administered 2020-03-08: 1000 mL via INTRAVENOUS

## 2020-03-08 MED ORDER — MORPHINE SULFATE (PF) 4 MG/ML IV SOLN
4.0000 mg | Freq: Once | INTRAVENOUS | Status: DC
  Filled 2020-03-08: qty 1

## 2020-03-08 MED ORDER — SUCRALFATE 1 G PO TABS
1.0000 g | ORAL_TABLET | Freq: Three times a day (TID) | ORAL | 0 refills | Status: DC
Start: 2020-03-08 — End: 2020-03-23

## 2020-03-08 MED ORDER — SODIUM CHLORIDE 0.9% FLUSH
3.0000 mL | Freq: Once | INTRAVENOUS | Status: AC
  Administered 2020-03-08: 3 mL via INTRAVENOUS

## 2020-03-08 MED ORDER — LIDOCAINE VISCOUS HCL 2 % MT SOLN
15.0000 mL | Freq: Once | OROMUCOSAL | Status: DC
  Filled 2020-03-08: qty 15

## 2020-03-08 MED ORDER — ALUM & MAG HYDROXIDE-SIMETH 200-200-20 MG/5ML PO SUSP
30.0000 mL | Freq: Once | ORAL | Status: DC
  Filled 2020-03-08: qty 30

## 2020-03-08 MED ORDER — ONDANSETRON HCL 4 MG/2ML IJ SOLN
4.0000 mg | Freq: Once | INTRAMUSCULAR | Status: AC
  Administered 2020-03-08: 4 mg via INTRAVENOUS
  Filled 2020-03-08: qty 2

## 2020-03-08 MED ORDER — FAMOTIDINE IN NACL 20-0.9 MG/50ML-% IV SOLN
20.0000 mg | Freq: Once | INTRAVENOUS | Status: DC
  Filled 2020-03-08: qty 50

## 2020-03-08 NOTE — ED Triage Notes (Signed)
Patient here from home reporting right upper abd pain, n/v, diarrhea that started "a couple of days ago". Seen by PCP, stated they called about elevated lipase. Requesting [place] of gallbladder.

## 2020-03-08 NOTE — ED Provider Notes (Signed)
[facility] DEPT Provider Note   CSN: [id number] Arrival date & time: 03/08/20  1126     History Chief Complaint  Patient presents with  . Abdominal Pain  . Nausea  . Emesis  . Diarrhea    Christina [name] is a 54 y.o. female with history of anxiety, migraine headaches, TMJ pain presents for evaluation of acute onset, progressively worsening abdominal pain with associated nausea and diarrhea for 6 days.  She reports feeling of abdominal bloating, generalized abdominal soreness but gnawing pain along the epigastrium radiating to the back.  Reports that she has been experiencing upwards of 10 episodes of watery stools daily which have now become somewhat bloody.  She denies recent travel, suspicious food intake, recent treatment with antibiotics.  Denies urinary symptoms, shortness of breath, or chest pain.  She has had chills and temperatures up to 99 F.  She has been taking ibuprofen with little relief.  She went to her PCP 3 days ago who ordered lab work and noted her lipase to be mildly elevated and sent her to the ED for further evaluation as her symptoms have progressively worsened.  The history is provided by the patient.       Past Medical History:  Diagnosis Date  . Anxiety   . Generalized headaches    Migraine     Patient Active Problem List   Diagnosis Date Noted  . Cellulitis of left toe 11/07/2017  . 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 Umbilical pain at incsion, prob muscle strain 03/22/2012  . S/P appendectomy 03/07/2012    Past Surgical History:  Procedure Laterality Date  . BREAST EXCISIONAL BIOPSY Right 2019   sclerosing lesion, adenosis, fibrocystic changes  . BREAST LUMPECTOMY WITH RADIOACTIVE SEED LOCALIZATION Right 06/08/2018   Procedure: RIGHT BREAST LUMPECTOMY WITH RADIOACTIVE SEED LOCALIZATION;  Surgeon: Coralie Keens, MD;  Location: [facility];  Service: General;  Laterality: Right;  . LAPAROSCOPIC APPENDECTOMY   02/16/2012   Procedure: APPENDECTOMY LAPAROSCOPIC;  Surgeon: Harl Bowie, MD;  Location: [facility];  Service: General;  Laterality: N/A;  . TONSILLECTOMY    . TUBAL LIGATION    . WISDOM TOOTH EXTRACTION       OB History   No obstetric history on file.     Family History  Problem Relation Age of Onset  . Heart disease Mother   . Stroke Mother   . Heart attack Mother   . Kidney failure Mother   . Cancer Father        prostate  . Parkinson's disease Father   . Colon cancer Neg Hx   . Esophageal cancer Neg Hx   . Rectal cancer Neg Hx   . Stomach cancer Neg Hx     Social History   Tobacco Use  . Smoking status: Never Smoker  . Smokeless tobacco: Never Used  Substance Use Topics  . Alcohol use: No    Alcohol/week: 0.0 standard drinks  . Drug use: No    Home Medications Prior to Admission medications   Medication Sig Start Date End Date Taking? Authorizing Provider  Lysine 500 MG TABS Take 500 mg by mouth daily.   Yes [provider]  Multiple Vitamins-Minerals (MULTIVITAMIN WITH MINERALS) tablet Take 1 tablet by mouth daily.   Yes [provider]  famotidine (PEPCID) 20 MG tablet Take 1 tablet (20 mg total) by mouth 2 (two) times daily. 03/08/20   Camisha Srey A, PA-C  ondansetron (ZOFRAN ODT) 4 MG  disintegrating tablet Take 1 tablet (4 mg total) by mouth every 8 (eight) hours as needed for nausea or vomiting. 03/08/20   Rodell Perna A, PA-C  sucralfate (CARAFATE) 1 g tablet Take 1 tablet (1 g total) by mouth 4 (four) times daily -  with meals and at bedtime. 03/08/20   Rodell Perna A, PA-C    Allergies    Patient has no known allergies.  Review of Systems   Review of Systems  Constitutional: Positive for chills.  Respiratory: Negative for shortness of breath.   Cardiovascular: Negative for chest pain.  Gastrointestinal: Positive for abdominal pain, blood in stool, diarrhea and nausea. Negative for vomiting.  Neurological: Positive for weakness  (generalized).  All other systems reviewed and are negative.   Physical Exam Updated Vital Signs BP 130/86 (BP Location: Left Arm)   Pulse 78   Temp 97.8 F (36.6 C) (Oral)   Resp 16   SpO2 100%   Physical Exam Vitals and nursing note reviewed.  Constitutional:      General: She is not in acute distress.    Appearance: She is well-developed.  HENT:     Head: Normocephalic and atraumatic.  Eyes:     General:        Right eye: No discharge.        Left eye: No discharge.     Conjunctiva/sclera: Conjunctivae normal.  Neck:     Vascular: No JVD.     Trachea: No tracheal deviation.  Cardiovascular:     Rate and Rhythm: Normal rate and regular rhythm.  Pulmonary:     Effort: Pulmonary effort is normal.     Breath sounds: Normal breath sounds.  Abdominal:     General: Abdomen is flat. Bowel sounds are decreased. There is no distension.     Palpations: Abdomen is soft.     Tenderness: There is generalized abdominal tenderness. There is no right CVA tenderness, left CVA tenderness, guarding or rebound.     Comments: Maximally tender to palpation in the epigastrium  Musculoskeletal:     Comments: No midline lumbar spine tenderness  Skin:    General: Skin is warm and dry.     Findings: No erythema.  Neurological:     Mental Status: She is alert.  Psychiatric:        Behavior: Behavior normal.     ED Results / Procedures / Treatments   Labs (all labs ordered are listed, but only abnormal results are displayed) Labs Reviewed  URINALYSIS, ROUTINE W REFLEX MICROSCOPIC - Abnormal; Notable for the following components:      Result Value   Leukocytes,Ua LARGE (*)    Bacteria, UA RARE (*)    Non Squamous Epithelial 0-5 (*)    All other components within normal limits  GASTROINTESTINAL PANEL BY PCR, STOOL (REPLACES STOOL CULTURE)  LIPASE, BLOOD  COMPREHENSIVE METABOLIC PANEL  CBC  I-STAT BETA HCG BLOOD, ED ([facility], [facility], [facility] ONLY)    EKG None  Radiology CT ABDOMEN PELVIS W  CONTRAST  Result Date: 03/08/2020 CLINICAL DATA:  Patient here from home reporting right upper abd pain, n/v, diarrhea that started "a couple of days ago". Seen by PCP, stated they called about elevated lipase. EXAM: CT ABDOMEN AND PELVIS WITH CONTRAST TECHNIQUE: Multidetector CT imaging of the abdomen and pelvis was performed using the standard protocol following bolus administration of intravenous contrast. CONTRAST:  131mL OMNIPAQUE IOHEXOL 300 MG/ML  SOLN COMPARISON:  CT abdomen pelvis 02/16/2012 FINDINGS: Lower chest: No acute  abnormality. Hepatobiliary: No focal liver abnormality is seen. No gallstones, gallbladder wall thickening, or biliary dilatation. Pancreas: Unremarkable. No pancreatic ductal dilatation or surrounding inflammatory changes. Spleen: Normal in size without focal abnormality. Adrenals/Urinary Tract: Adrenal glands are unremarkable. There are a few tiny hypodensities in the right kidney which are too small fully characterize but likely represent cysts. The left kidney is unremarkable. No renal calculi. No hydronephrosis. Bladder is unremarkable. Stomach/Bowel: Stomach is within normal limits. Appendix appears normal. No evidence of bowel wall thickening, distention, or inflammatory changes. Vascular/Lymphatic: No significant vascular findings are present. No enlarged abdominal or pelvic lymph nodes. Reproductive: Uterus and bilateral adnexa are unremarkable. Other: No abdominal wall hernia or abnormality. No abdominopelvic ascites. Musculoskeletal: No acute or significant osseous findings. IMPRESSION: No CT evidence of acute intra-abdominal pathology. Electronically Signed   By: Audie Pinto M.D.   On: 03/08/2020 15:15    Procedures Procedures (including critical care time)  Medications Ordered in ED Medications  iohexol (OMNIPAQUE) 9 MG/ML oral solution 500 mL (has no administration in time range)  sodium chloride flush (NS) 0.9 % injection 3 mL (3 mLs Intravenous Given  03/08/20 1234)  ondansetron (ZOFRAN) injection 4 mg (4 mg Intravenous Given 03/08/20 1314)  sodium chloride 0.9 % bolus 1,000 mL (0 mLs Intravenous Stopped 03/08/20 1738)  iohexol (OMNIPAQUE) 300 MG/ML solution 100 mL (100 mLs Intravenous Contrast Given 03/08/20 1437)    ED Course  I have reviewed the triage vital signs and the nursing notes.  Pertinent labs & imaging results that were available during my care of the patient were reviewed by me and considered in my medical decision making (see chart for details).    MDM Rules/Calculators/A&P                      Patient presenting for evaluation of abdominal pain, nausea and diarrhea for 6 days.  She is afebrile, vital signs are stable.  She is uncomfortable but nontoxic in appearance.  No rebound or guarding noted on examination of the abdomen.  Pain is worse along the epigastrium.  She was seen by her PCP 3 days ago with lab work drawn that showed mild elevation in her lipase.  She was scheduled for an outpatient right upper quadrant ultrasound but symptoms worsen so she came here for further evaluation.  Lab work reviewed and interpreted by myself shows no leukocytosis, no anemia, no metabolic derangements, no renal insufficiency.  Her lipase today is within normal limits on the lipase performed 3 days ago was only mildly elevated, not so elevated to suggest acute pancreatitis.  Her UA shows leukocytes and rare bacteria but is not a clean-catch and she has no urinary symptoms to suggest UTI.  CT of the abdomen pelvis shows no evidence of acute intra-abdominal pathology, specifically no evidence of pancreatitis and her gallbladder shows no gallstones or gallbladder wall thickening or biliary dilatation.  At this time I have a low suspicion of acute surgical abdominal pathology.  Stool studies were ordered but the patient was unable to provide a stool sample today.  On reevaluation she is resting comfortably reports that her symptoms improved with IV  fluids and Zofran.  She was offered other interventions including Pepcid, GI cocktail, and morphine but she declined all of these.  She is tolerating p.o. fluids without difficulty and serial abdominal examinations remain benign.  We discussed dietary and lifestyle modification to avoid acidic foods and NSAIDs as she takes ibuprofen fairly regularly.  Will  discharge with symptomatic management and recommend follow-up with PCP or gastroenterology on an outpatient basis for reevaluation of symptoms.  Discussed strict ED return precautions.  Patient and husband verbalized understanding of and agreement with plan and patient stable for discharge at this time.   Final Clinical Impression(s) / ED Diagnoses Final diagnoses:  Generalized abdominal pain    Rx / DC Orders ED Discharge Orders         Ordered    Ambulatory referral to Gastroenterology     03/08/20 1708    ondansetron (ZOFRAN ODT) 4 MG disintegrating tablet  Every 8 hours PRN     03/08/20 1709    famotidine (PEPCID) 20 MG tablet  2 times daily     03/08/20 1709    sucralfate (CARAFATE) 1 g tablet  3 times daily with meals & bedtime     03/08/20 1709           Renita Papa, PA-C 03/09/20 [place], Ankit, MD 03/09/20 [phone]

## 2020-03-08 NOTE — Discharge Instructions (Signed)
1. Medications: Take Zofran as needed for nausea.  Let this medicine dissolve under your tongue and wait around 10-20 minutes before eating or drinking after taking this medication.  Start taking Pepcid twice daily with meals.  Take Carafate with meals and at night. 2. Treatment: rest, drink plenty of fluids, advance diet slowly.  Eat a diet of bland foods that will not upset the stomach.  Avoid coffee, acidic foods, fried foods, and NSAIDs. 3. Follow Up: Please followup with your primary doctor and gastroenterology in 3-5 days for discussion of your diagnoses and further evaluation after today's visit; if you do not have a primary care doctor use the resource guide provided to find one; Please return to the ER for persistent vomiting, high fevers or worsening symptoms

## 2020-03-08 NOTE — ED Notes (Signed)
Patient has a urine culture in main lab 

## 2020-03-09 ENCOUNTER — Telehealth: Payer: Self-pay

## 2020-03-09 NOTE — Telephone Encounter (Signed)
Received call from Hunter @ [facility] needing confirmation on quantity for Pepcid Prescription. Confirmed via AVS. No further questions.

## 2020-03-09 NOTE — Telephone Encounter (Signed)
Received call from patient stating she went to pick up medication and did not have prescription for Pepcid 20mg . RN CM left prescription for [facility] for Pepcid 20mg  BID. Patient notified.

## 2020-03-10 ENCOUNTER — Telehealth: Payer: Self-pay

## 2020-03-10 ENCOUNTER — Telehealth: Payer: Self-pay | Admitting: Physician Assistant

## 2020-03-10 ENCOUNTER — Encounter: Payer: Self-pay | Admitting: Gastroenterology

## 2020-03-10 NOTE — Telephone Encounter (Signed)
Pt called back, asked her when her GI appt is? Pt said it is for June 23rd with [facility]. Told her okay good. Pt said she is still having loose stools and some abdominal discomfort. Asked pt if she is taking Pepcid 20 mg twice a day and Carafate that was prescribed for her? Pt said yes. Told pt if she would like we could see her prior to GI appt if symptoms are still present. Tried to schedule an appt but pt declined due to going on vacation and work. Pt said she will continue medications to see if they help more and call back if she wants to be seen prior to GI appt. Told pt okay.

## 2020-03-10 NOTE — Telephone Encounter (Signed)
Left message on voicemail to call office.  

## 2020-03-10 NOTE — Telephone Encounter (Signed)
When is patient's GI appointment?   If she is still having significant symptoms that are not improving, we can see her in the office while we await GI appointment.

## 2020-03-10 NOTE — Telephone Encounter (Signed)
Spoke with pt state that she will need the [place] order cancelled since she went to [facility] on the weekend and they did some Imaging, pt state that the ED Doctor advised for Dr Volanda Napoleon to review the CT results done at the hospital with pt and recommend the next step. Pt order for [place] was cancelled by Deb our office referral coordinator.

## 2020-03-10 NOTE — Telephone Encounter (Signed)
Please see message and advise 

## 2020-03-10 NOTE — Telephone Encounter (Signed)
Patient is calling in to let Aldona Bar know that she was seen in the ER, and had to have a CT scan done and they recommend for to follow up with a GI to which she is finding someone but wanted to know if she needed to follow up with her or to wait after she sees a GI.

## 2020-03-12 ENCOUNTER — Telehealth: Payer: Self-pay

## 2020-03-12 NOTE — Telephone Encounter (Signed)
Patient calling and states that she was recently at the ED for abdominal pain, nausea, and diarrhea. States that she has an appointment with GI later on in the month, but she is still having diarrhea. States that she has a few questions for [place]. Offered an appointment, patient denied. Please advise.  CB#: [phone]

## 2020-03-12 NOTE — Telephone Encounter (Signed)
Per chart review of ED visit, a referral to GI was made for pt.  Pt should receive a call about scheduling this appointment.   Pt should also schedule a f/u appt with her pcp or a TOC visit.

## 2020-03-13 NOTE — Telephone Encounter (Signed)
Spoke with pt state that she is scheduled to see GI on 04/01/20, pt has also spoke with her pcp and made them aware of the GI referral

## 2020-03-13 NOTE — Telephone Encounter (Signed)
Please call and see what questions she has. Offer an appointment. May need UC over weekend -- consider scheduling at [facility].

## 2020-03-13 NOTE — Telephone Encounter (Signed)
Spoke to pt asked her how I could help? Pt asked if the medication she was given. I said do you mean the Carafate. Pt said yes, it will slow down stools. Told her yes it should but she also needs to follow a BRAT or [employer]. Pt verbalized understanding. Pt asked if she needs to contact us after she sees GI? Told pt no the notes will be in the chart. Pt verbalized understanding.

## 2020-03-13 NOTE — Telephone Encounter (Signed)
See message and advise

## 2020-03-23 ENCOUNTER — Telehealth: Payer: Self-pay | Admitting: Physician Assistant

## 2020-03-23 ENCOUNTER — Ambulatory Visit (INDEPENDENT_AMBULATORY_CARE_PROVIDER_SITE_OTHER): Payer: BC Managed Care – PPO | Admitting: Physician Assistant

## 2020-03-23 ENCOUNTER — Encounter: Payer: Self-pay | Admitting: Physician Assistant

## 2020-03-23 ENCOUNTER — Other Ambulatory Visit: Payer: Self-pay

## 2020-03-23 VITALS — BP 120/80 | HR 81 | Temp 97.3°F | Ht 62.0 in | Wt 112.5 lb

## 2020-03-23 DIAGNOSIS — R1013 Epigastric pain: Secondary | ICD-10-CM | POA: Diagnosis not present

## 2020-03-23 LAB — CBC WITH DIFFERENTIAL/PLATELET
Basophils Absolute: 0 10*3/uL (ref 0.0–0.1)
Basophils Relative: 0.3 % (ref 0.0–3.0)
Eosinophils Absolute: 0 10*3/uL (ref 0.0–0.7)
Eosinophils Relative: 0.4 % (ref 0.0–5.0)
HCT: 43.3 % (ref 36.0–46.0)
Hemoglobin: 14.3 g/dL (ref 12.0–15.0)
Lymphocytes Relative: 10.5 % — ABNORMAL LOW (ref 12.0–46.0)
Lymphs Abs: 1.3 10*3/uL (ref 0.7–4.0)
MCHC: 32.9 g/dL (ref 30.0–36.0)
MCV: 94.4 fl (ref 78.0–100.0)
Monocytes Absolute: 0.5 10*3/uL (ref 0.1–1.0)
Monocytes Relative: 3.8 % (ref 3.0–12.0)
Neutro Abs: 10.7 10*3/uL — ABNORMAL HIGH (ref 1.4–7.7)
Neutrophils Relative %: 85 % — ABNORMAL HIGH (ref 43.0–77.0)
Platelets: 278 10*3/uL (ref 150.0–400.0)
RBC: 4.59 Mil/uL (ref 3.87–5.11)
RDW: 12.7 % (ref 11.5–15.5)
WBC: 12.7 10*3/uL — ABNORMAL HIGH (ref 4.0–10.5)

## 2020-03-23 LAB — COMPREHENSIVE METABOLIC PANEL
ALT: 11 U/L (ref 0–35)
AST: 16 U/L (ref 0–37)
Albumin: 4.7 g/dL (ref 3.5–5.2)
Alkaline Phosphatase: 57 U/L (ref 39–117)
BUN: 14 mg/dL (ref 6–23)
CO2: 26 mEq/L (ref 19–32)
Calcium: 9.2 mg/dL (ref 8.4–10.5)
Chloride: 103 mEq/L (ref 96–112)
Creatinine, Ser: 0.55 mg/dL (ref 0.40–1.20)
GFR: 114.47 mL/min (ref >60.00)
Glucose, Bld: 87 mg/dL (ref 70–99)
Potassium: 4.1 mEq/L (ref 3.5–5.1)
Sodium: 138 mEq/L (ref 135–145)
Total Bilirubin: 0.6 mg/dL (ref 0.2–1.2)
Total Protein: 6.8 g/dL (ref 6.0–8.3)

## 2020-03-23 LAB — LIPASE: Lipase: 47 U/L (ref 11.0–59.0)

## 2020-03-23 MED ORDER — FAMOTIDINE 20 MG PO TABS: 20.0000 mg | ORAL_TABLET | Freq: Two times a day (BID) | ORAL | 0 refills | Status: DC

## 2020-03-23 MED ORDER — SUCRALFATE 1 G PO TABS: 1.0000 g | ORAL_TABLET | Freq: Three times a day (TID) | ORAL | 0 refills | Status: DC

## 2020-03-23 NOTE — Patient Instructions (Signed)
It was great to see you!  Restart carafate and pepcid.  I will be in touch with your labs.  Please go to ER if any worsening of symptoms.   Take care,  Inda Coke PA-C

## 2020-03-23 NOTE — Telephone Encounter (Signed)
Noted  

## 2020-03-23 NOTE — Telephone Encounter (Signed)
Nurse Assessment Nurse: Kathi Ludwig, RN, Leana Roe Date/Time Christina [name] Time): 03/23/2020 8:22:42 AM Confirm and document reason for call. If symptomatic, describe symptoms. ---Caller states N/V and diarrhea on and off for several weeks. Has been to ER, scheduled with GI this Thursday. increased vomiting and diarrhea last night. Has nausea med and pepcid. Is out of Sucralfate 1 mg take 1 four times per day, wants a refill. Was told she could be seen at 1400 today. Has the patient had close contact with a person known or suspected to have the novel coronavirus illness OR traveled / lives in area with major community spread (including international travel) in the last 14 days from the onset of symptoms? * If Asymptomatic, screen for exposure and travel within the last 14 days. ---No Does the patient have any new or worsening symptoms? ---Yes Will a triage be completed? ---Yes Related visit to physician within the last 2 weeks? ---Yes Does the PT have any chronic conditions? (i.e. diabetes, asthma, this includes High risk factors for pregnancy, etc.) ---No Is this a behavioral health or substance abuse call? ---NoPLEASE NOTE: All timestamps contained within this report are represented as [place] Standard Time. CONFIDENTIALTY NOTICE: This fax transmission is intended only for the addressee. It contains information that is legally privileged, confidential or otherwise protected from use or disclosure. If you are not the intended recipient, you are strictly prohibited from reviewing, disclosing, copying using or disseminating any of this information or taking any action in reliance on or regarding this information. If you have received this fax in error, please notify us immediately by telephone so that we can arrange for its return to [place]. Phone: [phone], Toll-Free: [phone], Fax: [phone] Page: 2 of 2 Call Id: [id number] Guidelines Guideline Title Affirmed Question Affirmed Notes Nurse  Date/Time Christina [name] Time) Diarrhea [1] MODERATE diarrhea (e.g., 4-6 times / day more than normal) AND [2] present > 48 hours (2 days) Kathi Ludwig, RN, Tracie 03/23/2020 8:29:40 AM Disp. Time Christina [name] Time) Disposition Final User 03/23/2020 8:33:33 AM See PCP within 24 Hours Yes Kathi Ludwig, RN, Leana Roe Caller Disagree/Comply Comply Caller Understands Yes PreDisposition Did not know what to do Care Advice Given Per Guideline SEE PCP WITHIN 24 HOURS: * IF OFFICE WILL BE OPEN: You need to be examined within the next 24 hours. Call your doctor (or NP/PA) when the office opens and make an appointment. FLUID THERAPY DURING MILD TO MODERATE DIARRHEA: * Drink more fluids, at least 8-10 cups daily. One cup equals 8 oz (240 ml). * WATER: For mild to moderate diarrhea, water is often the best liquid to drink. You should also eat some salty foods (e.g., potato chips, pretzels, saltine crackers). This is important to make sure you are getting enough salt, sugars, and fluids to meet your body's needs. * SPORTS DRINKS: You can also drink a sports drinks (e.g., Gatorade, Powerade) to help treat and prevent dehydration. For it to work best, mix it half and half with water. FOOD AND NUTRITION DURING MILD TO MODERATE DIARRHEA: * Begin with boiled starches / cereals (e.g., potatoes, rice, noodles, wheat, oats) with a small amount of salt to taste. * Other foods that are OK include: bananas, yogurt, crackers, soup. DIARRHEA MEDICINE - LOPERAMIDE (IMODIUM AD): * This medicine helps decrease diarrhea. It is available over-thecounter (OTC) in a drug store. * Wash your hands after using the bathroom. CONTAGIOUSNESS: CALL BACK IF: * Signs of dehydration occur (e.g., no urine over 12 hours, very dry mouth, lightheaded, etc.) * Bloody stools * Constant  or severe abdominal pain * You become worse. CARE ADVICE given per Diarrhea (Adult) guideline.

## 2020-03-23 NOTE — Progress Notes (Signed)
Christina [name] is a 54 y.o. female here for a follow up of a pre-existing problem.  I acted as a Education administrator for [employer], PA-C Anselmo Pickler, LPN   History of Present Illness:   Chief Complaint  Patient presents with  . Nausea  . Emesis  . Diarrhea  . 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 Was found to have elevated lipase of 70. Unfortunately she had worsening symptoms and had to go to the ER on 03/08/20. She had an essentially normal work-up with improved lipase. CT scan was negative. She was prescribed carafate, famotidine, and zofran. She has been taking carafate and famotodine regularly.  Has worked on eating a very bland diet. She is sipping on water and gingerale. Symptoms have been overall well managed until last night. She woke-up with abdominal pain, nausea, vomiting and diarrhea. She states that she is having these significant episodes almost every two weeks (and it has been two weeks since she went to the ER.)   She is scheduled to see GI on Thurs.   She does state that her stools are "very dark."  Wt Readings from Last 5 Encounters:  03/23/20 112 lb 8 oz (51 kg)  03/05/20 114 lb (51.7 kg)  08/30/19 112 lb 3.2 oz (50.9 kg)  06/08/18 116 lb (52.6 kg)  11/07/17 117 lb 12.8 oz (53.4 kg)   Denies: chest pain, SOB, palpitations, weakness   Past Medical History:  Diagnosis Date  . Anxiety   . Generalized headaches    Migraine      Social History   Tobacco Use  . Smoking status: Never Smoker  . Smokeless tobacco: Never Used  Vaping Use  . Vaping Use: Never used  Substance Use Topics  . Alcohol use: No    Alcohol/week: 0.0 standard drinks  . Drug use: No    Past Surgical History:  Procedure Laterality Date  . BREAST EXCISIONAL BIOPSY Right 2019   sclerosing lesion, adenosis, fibrocystic changes  . BREAST LUMPECTOMY WITH RADIOACTIVE SEED LOCALIZATION Right 06/08/2018   Procedure: RIGHT  BREAST LUMPECTOMY WITH RADIOACTIVE SEED LOCALIZATION;  Surgeon: Coralie Keens, MD;  Location: [facility];  Service: General;  Laterality: Right;  . LAPAROSCOPIC APPENDECTOMY  02/16/2012   Procedure: APPENDECTOMY LAPAROSCOPIC;  Surgeon: Harl Bowie, MD;  Location: [facility];  Service: General;  Laterality: N/A;  . TONSILLECTOMY    . TUBAL LIGATION    . WISDOM TOOTH EXTRACTION      Family History  Problem Relation Age of Onset  . Heart disease Mother   . Stroke Mother   . Heart attack Mother   . Kidney failure Mother   . Cancer Father        prostate  . Parkinson's disease Father   . Colon cancer Neg Hx   . Esophageal cancer Neg Hx   . Rectal cancer Neg Hx   . Stomach cancer Neg Hx     No Known Allergies  Current Medications:   Current Outpatient Medications:  .  famotidine (PEPCID) 20 MG tablet, Take 1 tablet (20 mg total) by mouth 2 (two) times daily., Disp: 30 tablet, Rfl: 0 .  Lysine 500 MG TABS, Take 500 mg by mouth daily., Disp: , Rfl:  .  Multiple Vitamins-Minerals (MULTIVITAMIN WITH MINERALS) tablet, Take 1 tablet by mouth daily., Disp: , Rfl:  .  ondansetron (ZOFRAN ODT) 4 MG disintegrating tablet, Take 1 tablet (4  mg total) by mouth every 8 (eight) hours as needed for nausea or vomiting., Disp: 10 tablet, Rfl: 0 .  sucralfate (CARAFATE) 1 g tablet, Take 1 tablet (1 g total) by mouth 4 (four) times daily -  with meals and at bedtime., Disp: 28 tablet, Rfl: 0   Review of Systems:   ROS Negative unless otherwise specified per HPI.  Vitals:   Vitals:   03/23/20 1406  BP: 120/80  Pulse: 81  Temp: (!) 97.3 F (36.3 C)  TempSrc: Temporal  SpO2: 98%  Weight: 112 lb 8 oz (51 kg)  Height: 5\' 2"  (1.575 m)     Body mass index is 20.58 kg/m.  Physical Exam:   Physical Exam Vitals and nursing note reviewed.  Constitutional:      General: She is not in acute distress.    Appearance: She is well-developed. She is not ill-appearing or toxic-appearing.   Cardiovascular:     Rate and Rhythm: Normal rate and regular rhythm.     Pulses: Normal pulses.     Heart sounds: Normal heart sounds, S1 normal and S2 normal.     Comments: No LE edema Pulmonary:     Effort: Pulmonary effort is normal.     Breath sounds: Normal breath sounds.  Abdominal:     General: Abdomen is flat. Bowel sounds are normal.     Palpations: Abdomen is soft.     Tenderness: There is generalized abdominal tenderness.  Skin:    General: Skin is warm and dry.  Neurological:     Mental Status: She is alert.     GCS: GCS eye subscore is 4. GCS verbal subscore is 5. GCS motor subscore is 6.  Psychiatric:        Speech: Speech normal.        Behavior: Behavior normal. Behavior is cooperative.     Assessment and Plan:   [name] was seen today for nausea, emesis, diarrhea and abdominal pain.  Diagnoses and all orders for this visit:  Epigastric abdominal pain Uncontrolled. Will update lipase, CBC and CMP. Refill carafate and pepcid rx. If any worsening symptoms in the interim -- recommend that she go to the ER. GI appt scheduled for this Thursday. -     Lipase -     CBC with Differential/Platelet -     Comprehensive metabolic panel  Other orders -     sucralfate (CARAFATE) 1 g tablet; Take 1 tablet (1 g total) by mouth 4 (four) times daily -  with meals and at bedtime. -     famotidine (PEPCID) 20 MG tablet; Take 1 tablet (20 mg total) by mouth 2 (two) times daily.    . Reviewed expectations re: course of current medical issues. . Discussed self-management of symptoms. . Outlined signs and symptoms indicating need for more acute intervention. . Patient verbalized understanding and all questions were answered. . See orders for this visit as documented in the electronic medical record. . Patient received an After-Visit Summary.  CMA or LPN served as scribe during this visit. History, Physical, and Plan performed by medical provider. The above documentation has  been reviewed and is accurate and complete.   Christina Coke, PA-C

## 2020-03-26 ENCOUNTER — Ambulatory Visit: Payer: BC Managed Care – PPO | Admitting: Gastroenterology

## 2020-03-26 ENCOUNTER — Encounter: Payer: Self-pay | Admitting: Gastroenterology

## 2020-03-26 ENCOUNTER — Ambulatory Visit: Payer: BC Managed Care – PPO | Admitting: Physician Assistant

## 2020-03-26 VITALS — BP 110/72 | HR 80 | Ht 62.0 in | Wt 113.4 lb

## 2020-03-26 DIAGNOSIS — R634 Abnormal weight loss: Secondary | ICD-10-CM | POA: Diagnosis not present

## 2020-03-26 DIAGNOSIS — R1013 Epigastric pain: Secondary | ICD-10-CM | POA: Diagnosis not present

## 2020-03-26 DIAGNOSIS — R197 Diarrhea, unspecified: Secondary | ICD-10-CM

## 2020-03-26 DIAGNOSIS — Z01818 Encounter for other preprocedural examination: Secondary | ICD-10-CM | POA: Diagnosis not present

## 2020-03-26 MED ORDER — GLYCOPYRROLATE 1 MG PO TABS
1.0000 mg | ORAL_TABLET | Freq: Two times a day (BID) | ORAL | 11 refills | Status: DC
Start: 2020-03-26 — End: 2021-04-27

## 2020-03-26 NOTE — Patient Instructions (Signed)
We have sent the following medications to your pharmacy for you to pick up at your convenience: robinul.   You have been scheduled for an abdominal ultrasound at [facility] (1st floor of hospital) on 03/30/20 at 9:30am. Please arrive 15 minutes prior to your appointment for registration. Make certain not to have anything to eat or drink 6 hours prior to your appointment. Should you need to reschedule your appointment, please contact radiology at [phone]. This test typically takes about 30 minutes to perform.  You have been scheduled for an endoscopy. Please follow written instructions given to you at your visit today. If you use inhalers (even only as needed), please bring them with you on the day of your procedure.  Thank you for choosing me and [facility].  Pricilla Riffle. Dagoberto Ligas., MD., Marval Regal

## 2020-03-26 NOTE — Progress Notes (Signed)
History of Present Illness: This is a 54 year old female referred by Christina Papa, PA-C for the evaluation of epigastric pain, RUQ pain, early satiety, fullness, weight loss, nausea, vomiting, intermittent diarrhea for 6 weeks. Evaluated by PCP on May 27, June 14 and in ED on May 30. Most recent CBC, CMP, lipase unremarkable except WBC=12.7.  She had 1 day of vomiting with worsening diarrhea.  Vomiting resolved.  Diarrhea has has improved over the past few days however stools remain loose.  Epigastric pain, RUQ pain, fullness, nausea, early satiety persist.  Symptoms exacerbated by meals.  Carafate provides minimal relief.  Zofran is effective for nausea.  Denies constipation, change in stool caliber, melena, hematochezia, dysphagia, reflux symptoms, chest pain.  Colonoscopy 04/2016 - Internal hemorrhoids. - Diverticulosis in the sigmoid colon. - The examination was otherwise normal on direct and retroflexion views. - No specimens collected.   CT AP 03/08/2020 IMPRESSION: No CT evidence of acute intra-abdominal pathology.   No Known Allergies Outpatient Medications Prior to Visit  Medication Sig Dispense Refill  . famotidine (PEPCID) 20 MG tablet Take 1 tablet (20 mg total) by mouth 2 (two) times daily. 30 tablet 0  . ondansetron (ZOFRAN ODT) 4 MG disintegrating tablet Take 1 tablet (4 mg total) by mouth every 8 (eight) hours as needed for nausea or vomiting. 10 tablet 0  . sucralfate (CARAFATE) 1 g tablet Take 1 tablet (1 g total) by mouth 4 (four) times daily -  with meals and at bedtime. 28 tablet 0  . Lysine 500 MG TABS Take 500 mg by mouth daily. (Patient not taking: Reported on 03/26/2020)    . Multiple Vitamins-Minerals (MULTIVITAMIN WITH MINERALS) tablet Take 1 tablet by mouth daily. (Patient not taking: Reported on 03/26/2020)     No facility-administered medications prior to visit.   Past Medical History:  Diagnosis Date  . Generalized headaches    Migraine   . Situational  anxiety    related to daughters death   Past Surgical History:  Procedure Laterality Date  . BREAST EXCISIONAL BIOPSY Right 2019   sclerosing lesion, adenosis, fibrocystic changes  . BREAST LUMPECTOMY WITH RADIOACTIVE SEED LOCALIZATION Right 06/08/2018   Procedure: RIGHT BREAST LUMPECTOMY WITH RADIOACTIVE SEED LOCALIZATION;  Surgeon: Coralie Keens, MD;  Location: [facility];  Service: General;  Laterality: Right;  . LAPAROSCOPIC APPENDECTOMY  02/16/2012   Procedure: APPENDECTOMY LAPAROSCOPIC;  Surgeon: Harl Bowie, MD;  Location: [facility];  Service: General;  Laterality: N/A;  . TONSILLECTOMY    . TUBAL LIGATION    . WISDOM TOOTH EXTRACTION     Social History   Socioeconomic History  . Marital status: Married    Spouse name: Not on file  . Number of children: Not on file  . Years of education: Not on file  . Highest education level: Not on file  Occupational History  . Not on file  Tobacco Use  . Smoking status: Never Smoker  . Smokeless tobacco: Never Used  Vaping Use  . Vaping Use: Never used  Substance and Sexual Activity  . Alcohol use: No    Alcohol/week: 0.0 standard drinks  . Drug use: No  . Sexual activity: Never    Birth control/protection: Surgical  Other Topics Concern  . Not on file  Social History Narrative   She works in [place] for [employer]   She has two children, 64 and 24, one child is addicted to drugs   She likes to read  Walking for exercise   Social Determinants of Health   Financial Resource Strain:   . Difficulty of Paying Living Expenses:   Food Insecurity:   . Worried About Charity fundraiser in the Last Year:   . Arboriculturist in the Last Year:   Transportation Needs:   . Film/video editor (Medical):   [name] Lack of Transportation (Non-Medical):   Physical Activity:   . Days of Exercise per Week:   . Minutes of Exercise per Session:   Stress:   . Feeling of Stress :   Social Connections:   . Frequency of Communication  with Friends and Family:   . Frequency of Social Gatherings with Friends and Family:   . Attends Religious Services:   . Active Member of Clubs or Organizations:   . Attends Archivist Meetings:   [name] Marital Status:    Family History  Problem Relation Age of Onset  . Heart disease Mother   . Stroke Mother   . Heart attack Mother   . Kidney failure Mother   . Cancer Father        prostate  . Parkinson's disease Father   . Colon cancer Neg Hx   . Esophageal cancer Neg Hx   . Rectal cancer Neg Hx   . Stomach cancer Neg Hx       Review of Systems: Pertinent positive and negative review of systems were noted in the above HPI section. All other review of systems were otherwise negative.   Physical Exam: General: Well developed, well nourished, no acute distress Head: Normocephalic and atraumatic Eyes:  sclerae anicteric, EOMI Ears: Normal auditory acuity Mouth: Not examined, mask on during Covid-19 pandemic Neck: Supple, no masses or thyromegaly Lungs: Clear throughout to auscultation Heart: Regular rate and rhythm; no murmurs, rubs or bruits Abdomen: Soft, generalized mild tenderness and moderate in the epigastrium and right upper quadrant and non distended. No masses, hepatosplenomegaly or hernias noted. Normal Bowel sounds Rectal: Not done Musculoskeletal: Symmetrical with no gross deformities  Skin: No lesions on visible extremities Pulses:  Normal pulses noted Extremities: No clubbing, cyanosis, edema or deformities noted Neurological: Alert oriented x 4, grossly nonfocal Cervical Nodes:  No significant cervical adenopathy Inguinal Nodes: No significant inguinal adenopathy Psychological:  Alert and cooperative. Normal mood and affect   Assessment and Recommendations:  1. Epigastric pain, RUQ pain, weight loss, nausea, fullness, early satiety, intermittent diarrhea.  Rule out cholelithiasis, GERD, gastritis, ulcer.  Suspected IBS.  Although CT scan did not show  cholelithiasis ultrasound is more sensitive for cholelithiasis.  Schedule RUQ ultrasound.  Schedule EGD. The risks (including bleeding, perforation, infection, missed lesions, medication reactions and possible hospitalization or surgery if complications occur), benefits, and alternatives to endoscopy with possible biopsy and possible dilation were discussed with the patient and they consent to proceed. Continue Pepcid, Carafate, Zofran.  Begin glycopyrrolate 1 mg p.o. twice daily.  Patient is advised to call if symptoms not significantly improving over the next few days.   cc: Christina Papa, PA-C [address] [place],  [place] [id number]

## 2020-03-30 ENCOUNTER — Ambulatory Visit (HOSPITAL_COMMUNITY)
Admission: RE | Admit: 2020-03-30 | Discharge: 2020-03-30 | Disposition: A | Discharge status: Home / Self Care | Payer: BC Managed Care – PPO | Source: Ambulatory Visit | Attending: Gastroenterology | Admitting: Gastroenterology

## 2020-03-30 ENCOUNTER — Other Ambulatory Visit: Payer: Self-pay

## 2020-03-30 ENCOUNTER — Ambulatory Visit: Payer: BC Managed Care – PPO | Admitting: Gastroenterology

## 2020-03-30 DIAGNOSIS — K828 Other specified diseases of gallbladder: Secondary | ICD-10-CM | POA: Diagnosis not present

## 2020-03-30 DIAGNOSIS — K824 Cholesterolosis of gallbladder: Secondary | ICD-10-CM | POA: Diagnosis not present

## 2020-03-30 DIAGNOSIS — R1013 Epigastric pain: Secondary | ICD-10-CM | POA: Insufficient documentation

## 2020-03-30 DIAGNOSIS — R634 Abnormal weight loss: Secondary | ICD-10-CM | POA: Diagnosis not present

## 2020-03-31 ENCOUNTER — Telehealth: Payer: Self-pay | Admitting: Gastroenterology

## 2020-03-31 ENCOUNTER — Other Ambulatory Visit: Payer: Self-pay | Admitting: Gastroenterology

## 2020-03-31 ENCOUNTER — Ambulatory Visit (INDEPENDENT_AMBULATORY_CARE_PROVIDER_SITE_OTHER): Payer: BC Managed Care – PPO

## 2020-03-31 DIAGNOSIS — Z1159 Encounter for screening for other viral diseases: Secondary | ICD-10-CM | POA: Diagnosis not present

## 2020-03-31 NOTE — Telephone Encounter (Signed)
See result note.  

## 2020-04-01 ENCOUNTER — Ambulatory Visit: Payer: BC Managed Care – PPO | Admitting: Gastroenterology

## 2020-04-01 LAB — SARS CORONAVIRUS 2 (TAT 6-24 HRS): SARS Coronavirus 2: NEGATIVE

## 2020-04-03 ENCOUNTER — Encounter: Payer: Self-pay | Admitting: Gastroenterology

## 2020-04-03 ENCOUNTER — Ambulatory Visit (AMBULATORY_SURGERY_CENTER): Payer: BC Managed Care – PPO | Admitting: Gastroenterology

## 2020-04-03 ENCOUNTER — Other Ambulatory Visit: Payer: Self-pay

## 2020-04-03 VITALS — BP 116/80 | HR 101 | Temp 97.5°F | Resp 12 | Ht 62.0 in | Wt 113.0 lb

## 2020-04-03 DIAGNOSIS — K317 Polyp of stomach and duodenum: Secondary | ICD-10-CM

## 2020-04-03 DIAGNOSIS — R1013 Epigastric pain: Secondary | ICD-10-CM

## 2020-04-03 DIAGNOSIS — K2950 Unspecified chronic gastritis without bleeding: Secondary | ICD-10-CM | POA: Diagnosis not present

## 2020-04-03 DIAGNOSIS — R634 Abnormal weight loss: Secondary | ICD-10-CM

## 2020-04-03 DIAGNOSIS — R6881 Early satiety: Secondary | ICD-10-CM

## 2020-04-03 DIAGNOSIS — K2970 Gastritis, unspecified, without bleeding: Secondary | ICD-10-CM

## 2020-04-03 MED ORDER — SODIUM CHLORIDE 0.9 % IV SOLN: 500.0000 mL | Freq: Once | INTRAVENOUS | Status: DC

## 2020-04-03 MED ORDER — PANTOPRAZOLE SODIUM 40 MG PO TBEC: 40.0000 mg | DELAYED_RELEASE_TABLET | Freq: Every day | ORAL | 3 refills | Status: DC

## 2020-04-03 NOTE — Progress Notes (Signed)
To Pacu, VSS  Report to Rn. VO for additional meds.tb

## 2020-04-03 NOTE — Progress Notes (Signed)
Called to room to assist during endoscopic procedure.  Patient ID and intended procedure confirmed with present staff. Received instructions for my participation in the procedure from the performing physician.  

## 2020-04-03 NOTE — Op Note (Signed)
[facility] Patient Name: Christina [name] Procedure Date: 04/03/2020 2:49 PM MRN: [id number] Endoscopist: Ladene Artist , MD Age: 54 Referring MD:  Date of Birth: 1966/09/01 Gender: Female Account #: [id number] Procedure:                Upper GI endoscopy Indications:              Epigastric abdominal pain, Early satiety, Weight                            loss Medicines:                Monitored Anesthesia Care Procedure:                Pre-Anesthesia Assessment:                           - Prior to the procedure, a History and Physical                            was performed, and patient medications and                            allergies were reviewed. The patient's tolerance of                            previous anesthesia was also reviewed. The risks                            and benefits of the procedure and the sedation                            options and risks were discussed with the patient.                            All questions were answered, and informed consent                            was obtained. Prior Anticoagulants: The patient has                            taken no previous anticoagulant or antiplatelet                            agents. ASA Grade Assessment: II - A patient with                            mild systemic disease. After reviewing the risks                            and benefits, the patient was deemed in                            satisfactory condition to undergo the procedure.  After obtaining informed consent, the endoscope was                            passed under direct vision. Throughout the                            procedure, the patient's blood pressure, pulse, and                            oxygen saturations were monitored continuously. The                            Endoscope was introduced through the mouth, and                            advanced to the second part of duodenum. The upper                             GI endoscopy was accomplished without difficulty.                            The patient tolerated the procedure well. Scope In: Scope Out: Findings:                 The examined esophagus was normal.                           Patchy mildly erythematous mucosa with bleeding on                            contact in the fundus was found in the entire                            examined stomach. Biopsies were taken with a cold                            forceps for histology.                           Multiple 3 to 6 mm sessile polyps with no bleeding                            and no stigmata of recent bleeding were found in                            the gastric fundus and in the gastric body.                            Biopsies were taken with a cold forceps for                            histology.  The exam of the stomach was otherwise normal.                           The duodenal bulb and second portion of the                            duodenum were normal. Complications:            No immediate complications. Estimated Blood Loss:     Estimated blood loss was minimal. Impression:               - Normal esophagus.                           - Erythematous mucosa in the stomach. Biopsied.                           - Multiple gastric polyps. Biopsied.                           - Normal duodenal bulb and second portion of the                            duodenum. Recommendation:           - Patient has a contact number available for                            emergencies. The signs and symptoms of potential                            delayed complications were discussed with the                            patient. Return to normal activities tomorrow.                            Written discharge instructions were provided to the                            patient.                           - Resume previous diet.                            - Continue present medications including                            glycopyrrolate 1 mg po bid.                           - Await pathology results.                           - Protonix (pantoprazole) 40 mg PO daily, 1 year of  refills.                           - Return to GI office in 6 weeks. Ladene Artist, MD 04/03/2020 3:13:22 PM This report has been signed electronically.

## 2020-04-03 NOTE — Progress Notes (Signed)
Pt. States that she is very sensitive to anesthesia and has a difficult time waking up.  She states that she wants that noted in her chart.

## 2020-04-03 NOTE — Patient Instructions (Signed)
Handouts given for Gastritis.  Continue medications including glycopyrrolate 1 mg twice a day.  Start Protonix 40 mg once a day ( 1 year of refills)  YOU HAD AN ENDOSCOPIC PROCEDURE TODAY AT [facility]:   Refer to the procedure report that was given to you for any specific questions about what was found during the examination.  If the procedure report does not answer your questions, please call your gastroenterologist to clarify.  If you requested that your care partner not be given the details of your procedure findings, then the procedure report has been included in a sealed envelope for you to review at your convenience later.  YOU SHOULD EXPECT: Some feelings of bloating in the abdomen. Passage of more gas than usual.  Walking can help get rid of the air that was put into your GI tract during the procedure and reduce the bloating. If you had a lower endoscopy (such as a colonoscopy or flexible sigmoidoscopy) you may notice spotting of blood in your stool or on the toilet paper. If you underwent a bowel prep for your procedure, you may not have a normal bowel movement for a few days.  Please Note:  You might notice some irritation and congestion in your nose or some drainage.  This is from the oxygen used during your procedure.  There is no need for concern and it should clear up in a day or so.  SYMPTOMS TO REPORT IMMEDIATELY:   Following upper endoscopy (EGD)  Vomiting of blood or coffee ground material  New chest pain or pain under the shoulder blades  Painful or persistently difficult swallowing  New shortness of breath  Fever of 100F or higher  Black, tarry-looking stools  For urgent or emergent issues, a gastroenterologist can be reached at any hour by calling [phone]. Do not use MyChart messaging for urgent concerns.    DIET:  We do recommend a small meal at first, but then you may proceed to your regular diet.  Drink plenty of fluids but you should avoid  alcoholic beverages for 24 hours.  ACTIVITY:  You should plan to take it easy for the rest of today and you should NOT DRIVE or use heavy machinery until tomorrow (because of the sedation medicines used during the test).    FOLLOW UP: Our staff will call the number listed on your records 48-72 hours following your procedure to check on you and address any questions or concerns that you may have regarding the information given to you following your procedure. If we do not reach you, we will leave a message.  We will attempt to reach you two times.  During this call, we will ask if you have developed any symptoms of COVID 19. If you develop any symptoms (ie: fever, flu-like symptoms, shortness of breath, cough etc.) before then, please call [phone].  If you test positive for Covid 19 in the 2 weeks post procedure, please call and report this information to [place].    If any biopsies were taken you will be contacted by phone or by letter within the next 1-3 weeks.  Please call us at [phone] if you have not heard about the biopsies in 3 weeks.    SIGNATURES/CONFIDENTIALITY: You and/or your care partner have signed paperwork which will be entered into your electronic medical record.  These signatures attest to the fact that that the information above on your After Visit Summary has been reviewed and is understood.  Full  responsibility of the confidentiality of this discharge information lies with you and/or your care-partner.

## 2020-04-06 ENCOUNTER — Encounter: Payer: BC Managed Care – PPO | Admitting: Gastroenterology

## 2020-04-06 ENCOUNTER — Other Ambulatory Visit: Payer: Self-pay

## 2020-04-06 ENCOUNTER — Telehealth: Payer: Self-pay | Admitting: Gastroenterology

## 2020-04-06 DIAGNOSIS — R6881 Early satiety: Secondary | ICD-10-CM

## 2020-04-06 DIAGNOSIS — R634 Abnormal weight loss: Secondary | ICD-10-CM

## 2020-04-06 DIAGNOSIS — R1013 Epigastric pain: Secondary | ICD-10-CM

## 2020-04-06 NOTE — Telephone Encounter (Signed)
Spoke with patient, patient scheduled for HIDA scan at [facility] on 04/10/2020 at 12:30 pm with a 12:15 pm arrival time. Pt advised to be NPO 6 hours prior to scan. Pt aware of appointment and restrictions for scan.

## 2020-04-06 NOTE — Telephone Encounter (Signed)
Patient is calling to schedule HIDA scan

## 2020-04-06 NOTE — Telephone Encounter (Signed)
Pls call pt, she would like to r/s HIDA scan.

## 2020-04-07 ENCOUNTER — Telehealth: Payer: Self-pay | Admitting: *Deleted

## 2020-04-07 NOTE — Telephone Encounter (Signed)
No answer for post procedure call back. Left message for patient and will callback later today. 

## 2020-04-07 NOTE — Telephone Encounter (Signed)
Pt returned call. States she is doing good.

## 2020-04-07 NOTE — Telephone Encounter (Signed)
  Follow up Call-  Call back number 04/03/2020  Post procedure Call Back phone  # [phone]  Permission to leave phone message Yes  Some recent data might be hidden     Patient questions:  Do you have a fever, pain , or abdominal swelling? No. Pain Score  0 *  Have you tolerated food without any problems? Yes.    Have you been able to return to your normal activities? Yes.    Do you have any questions about your discharge instructions: Diet   No. Medications  No. Follow up visit  No.  Do you have questions or concerns about your Care? No.  Actions: * If pain score is 4 or above: No action needed, pain <4.  1. Have you developed a fever since your procedure? no  2.   Have you had an respiratory symptoms (SOB or cough) since your procedure? no  3.   Have you tested positive for COVID 19 since your procedure no  4.   Have you had any family members/close contacts diagnosed with the COVID 19 since your procedure?  no   If yes to any of these questions please route to Joylene John, RN and Erenest Rasher, RN

## 2020-04-08 ENCOUNTER — Telehealth: Payer: Self-pay | Admitting: Gastroenterology

## 2020-04-08 ENCOUNTER — Encounter: Payer: Self-pay | Admitting: Gastroenterology

## 2020-04-08 NOTE — Telephone Encounter (Signed)
Proceed with HIDA as planned

## 2020-04-08 NOTE — Telephone Encounter (Signed)
Left detailed message for patient to keep appt for HIDA scan on Friday. Pt advised to call if she had any questions or concerns.

## 2020-04-08 NOTE — Telephone Encounter (Signed)
Spoke with patient regarding pathology results.   Patient wanting to know if you would like her to keep her scheduled appt for HIDA scan this Friday, would you like her to proceed? Or can she cancel appt for now? Please advise.

## 2020-04-08 NOTE — Telephone Encounter (Signed)
See pathology letter dated today.

## 2020-04-08 NOTE — Telephone Encounter (Signed)
Dr. Fuller Plan,  See below. Thanks

## 2020-04-08 NOTE — Telephone Encounter (Signed)
Patient calling about path results as she can see it in [place]., please call her.

## 2020-04-10 ENCOUNTER — Other Ambulatory Visit: Payer: Self-pay

## 2020-04-10 ENCOUNTER — Ambulatory Visit (HOSPITAL_COMMUNITY)
Admission: RE | Admit: 2020-04-10 | Discharge: 2020-04-10 | Disposition: A | Discharge status: Home / Self Care | Payer: BC Managed Care – PPO | Source: Ambulatory Visit | Attending: Gastroenterology | Admitting: Gastroenterology

## 2020-04-10 DIAGNOSIS — R1013 Epigastric pain: Secondary | ICD-10-CM | POA: Insufficient documentation

## 2020-04-10 DIAGNOSIS — R634 Abnormal weight loss: Secondary | ICD-10-CM | POA: Diagnosis not present

## 2020-04-10 DIAGNOSIS — R6881 Early satiety: Secondary | ICD-10-CM

## 2020-04-10 DIAGNOSIS — R1011 Right upper quadrant pain: Secondary | ICD-10-CM | POA: Diagnosis not present

## 2020-04-10 DIAGNOSIS — R197 Diarrhea, unspecified: Secondary | ICD-10-CM | POA: Diagnosis not present

## 2020-04-10 DIAGNOSIS — R112 Nausea with vomiting, unspecified: Secondary | ICD-10-CM | POA: Diagnosis not present

## 2020-04-10 MED ORDER — TECHNETIUM TC 99M MEBROFENIN IV KIT
5.0300 | PACK | Freq: Once | INTRAVENOUS | Status: AC | PRN
  Administered 2020-04-10: 5.03 via INTRAVENOUS

## 2020-04-30 ENCOUNTER — Ambulatory Visit: Payer: BC Managed Care – PPO | Admitting: Gastroenterology

## 2020-04-30 ENCOUNTER — Encounter: Payer: Self-pay | Admitting: Gastroenterology

## 2020-04-30 VITALS — BP 108/60 | HR 64 | Ht 62.0 in | Wt 111.0 lb

## 2020-04-30 DIAGNOSIS — R1011 Right upper quadrant pain: Secondary | ICD-10-CM

## 2020-04-30 NOTE — Progress Notes (Signed)
    History of Present Illness: This is a 54 year old female returning for follow-up of epigastric pain, RUQ pain, nausea, early satiety, weight loss.  Her symptoms have resolved on pantoprazole and glycopyrrolate.  She states her appetite is not quite back to normal however all other symptoms have resolved.   GB EF=41% 04/2020  RUQ US IMPRESSION 03/2020: Small gallbladder polyp and mild gallbladder sludge.  EGD 03/2020 - Normal esophagus. - Erythematous mucosa in the stomach. Biopsied. - Multiple gastric polyps. Biopsied. - Normal duodenal bulb and second portion of the duodenum. Path: mild chronic gastritis, benign fundic gland polyps.   Current Medications, Allergies, Past Medical History, Past Surgical History, Family History and Social History were reviewed in [employer] record.   Physical Exam: General: Well developed, well nourished, no acute distress Head: Normocephalic and atraumatic Eyes:  sclerae anicteric, EOMI Ears: Normal auditory acuity Mouth: Not examined, mask on during Covid-19 pandemic Lungs: Clear throughout to auscultation Heart: Regular rate and rhythm; no murmurs, rubs or bruits Abdomen: Soft, minimal epigastric tenderness and non distended. No masses, hepatosplenomegaly or hernias noted. Normal Bowel sounds Rectal: Not done  Musculoskeletal: Symmetrical with no gross deformities  Pulses:  Normal pulses noted Extremities: No clubbing, cyanosis, edema or deformities noted Neurological: Alert oriented x 4, grossly nonfocal Psychological:  Alert and cooperative. Normal mood and affect   Assessment and Recommendations:  1. Epigastric pain, RUQ pain, early satiety, nausea have all resolved.  Her appetite is not quite normal but has improved.  Her weight is stable.  We reviewed the results of testing done today and reassured her.  She is advised to remain on pantoprazole 40 mg daily and glycopyrrolate 1 mg twice daily.  Consider  discontinuing one or both medications at her return office visit in 2 months pending he course. REV in 2 months.   2.  CRC screening, average risk.  A 10-year interval screening colonoscopy is recommended in July 2027.

## 2020-04-30 NOTE — Patient Instructions (Signed)
Thank you for choosing me and [facility].  Malcolm T. Stark, Jr., MD., FACG  

## 2020-05-08 ENCOUNTER — Ambulatory Visit
Admission: RE | Admit: 2020-05-08 | Discharge: 2020-05-08 | Disposition: A | Discharge status: Home / Self Care | Payer: BC Managed Care – PPO | Source: Ambulatory Visit | Attending: Obstetrics and Gynecology | Admitting: Obstetrics and Gynecology

## 2020-05-08 ENCOUNTER — Other Ambulatory Visit: Payer: Self-pay

## 2020-05-08 DIAGNOSIS — Z1231 Encounter for screening mammogram for malignant neoplasm of breast: Secondary | ICD-10-CM

## 2020-06-03 DIAGNOSIS — N760 Acute vaginitis: Secondary | ICD-10-CM | POA: Diagnosis not present

## 2020-06-03 DIAGNOSIS — R309 Painful micturition, unspecified: Secondary | ICD-10-CM | POA: Diagnosis not present

## 2020-07-01 ENCOUNTER — Ambulatory Visit: Payer: BC Managed Care – PPO | Admitting: Gastroenterology

## 2020-07-08 DIAGNOSIS — Z6821 Body mass index (BMI) 21.0-21.9, adult: Secondary | ICD-10-CM | POA: Diagnosis not present

## 2020-07-08 DIAGNOSIS — Z01419 Encounter for gynecological examination (general) (routine) without abnormal findings: Secondary | ICD-10-CM | POA: Diagnosis not present

## 2020-07-08 DIAGNOSIS — Z13820 Encounter for screening for osteoporosis: Secondary | ICD-10-CM | POA: Diagnosis not present

## 2020-07-20 DIAGNOSIS — Z13220 Encounter for screening for lipoid disorders: Secondary | ICD-10-CM | POA: Diagnosis not present

## 2020-07-20 DIAGNOSIS — N951 Menopausal and female climacteric states: Secondary | ICD-10-CM | POA: Diagnosis not present

## 2020-07-20 DIAGNOSIS — Z951 Presence of aortocoronary bypass graft: Secondary | ICD-10-CM | POA: Diagnosis not present

## 2020-07-20 LAB — LIPID PANEL
Cholesterol: 237 — AB (ref 0–200)
HDL: 65 (ref 35–70)
LDL Cholesterol: 157
LDl/HDL Ratio: 2.4
Triglycerides: 85 (ref 40–160)

## 2020-07-22 ENCOUNTER — Encounter: Payer: Self-pay | Admitting: Physician Assistant

## 2020-08-25 DIAGNOSIS — M19042 Primary osteoarthritis, left hand: Secondary | ICD-10-CM | POA: Diagnosis not present

## 2020-08-25 DIAGNOSIS — M2550 Pain in unspecified joint: Secondary | ICD-10-CM | POA: Diagnosis not present

## 2020-08-25 DIAGNOSIS — M79671 Pain in right foot: Secondary | ICD-10-CM | POA: Diagnosis not present

## 2020-08-25 DIAGNOSIS — M160 Bilateral primary osteoarthritis of hip: Secondary | ICD-10-CM | POA: Diagnosis not present

## 2020-08-25 DIAGNOSIS — M549 Dorsalgia, unspecified: Secondary | ICD-10-CM | POA: Diagnosis not present

## 2020-08-25 DIAGNOSIS — M19041 Primary osteoarthritis, right hand: Secondary | ICD-10-CM | POA: Diagnosis not present

## 2020-08-25 DIAGNOSIS — M79672 Pain in left foot: Secondary | ICD-10-CM | POA: Diagnosis not present

## 2020-08-25 DIAGNOSIS — M79641 Pain in right hand: Secondary | ICD-10-CM | POA: Diagnosis not present

## 2020-08-25 DIAGNOSIS — M79643 Pain in unspecified hand: Secondary | ICD-10-CM | POA: Diagnosis not present

## 2020-08-25 DIAGNOSIS — M19032 Primary osteoarthritis, left wrist: Secondary | ICD-10-CM | POA: Diagnosis not present

## 2020-08-25 DIAGNOSIS — M19072 Primary osteoarthritis, left ankle and foot: Secondary | ICD-10-CM | POA: Diagnosis not present

## 2020-08-25 DIAGNOSIS — M79642 Pain in left hand: Secondary | ICD-10-CM | POA: Diagnosis not present

## 2020-08-25 DIAGNOSIS — M19031 Primary osteoarthritis, right wrist: Secondary | ICD-10-CM | POA: Diagnosis not present

## 2020-08-25 DIAGNOSIS — I7300 Raynaud's syndrome without gangrene: Secondary | ICD-10-CM | POA: Diagnosis not present

## 2020-08-25 DIAGNOSIS — M19071 Primary osteoarthritis, right ankle and foot: Secondary | ICD-10-CM | POA: Diagnosis not present

## 2020-08-25 DIAGNOSIS — M545 Low back pain, unspecified: Secondary | ICD-10-CM | POA: Diagnosis not present

## 2020-08-25 DIAGNOSIS — M1990 Unspecified osteoarthritis, unspecified site: Secondary | ICD-10-CM | POA: Diagnosis not present

## 2020-09-01 ENCOUNTER — Ambulatory Visit: Payer: BC Managed Care – PPO | Admitting: Gastroenterology

## 2020-09-26 DIAGNOSIS — U071 COVID-19: Secondary | ICD-10-CM | POA: Diagnosis not present

## 2020-09-26 DIAGNOSIS — Z20822 Contact with and (suspected) exposure to covid-19: Secondary | ICD-10-CM | POA: Diagnosis not present

## 2020-11-25 DIAGNOSIS — M79643 Pain in unspecified hand: Secondary | ICD-10-CM | POA: Diagnosis not present

## 2020-11-25 DIAGNOSIS — M2550 Pain in unspecified joint: Secondary | ICD-10-CM | POA: Diagnosis not present

## 2020-11-25 DIAGNOSIS — M1990 Unspecified osteoarthritis, unspecified site: Secondary | ICD-10-CM | POA: Diagnosis not present

## 2020-11-25 DIAGNOSIS — I7300 Raynaud's syndrome without gangrene: Secondary | ICD-10-CM | POA: Diagnosis not present

## 2021-02-20 DIAGNOSIS — J069 Acute upper respiratory infection, unspecified: Secondary | ICD-10-CM | POA: Diagnosis not present

## 2021-03-03 DIAGNOSIS — I7300 Raynaud's syndrome without gangrene: Secondary | ICD-10-CM | POA: Diagnosis not present

## 2021-03-03 DIAGNOSIS — M1990 Unspecified osteoarthritis, unspecified site: Secondary | ICD-10-CM | POA: Diagnosis not present

## 2021-03-03 DIAGNOSIS — M2550 Pain in unspecified joint: Secondary | ICD-10-CM | POA: Diagnosis not present

## 2021-03-03 DIAGNOSIS — M79643 Pain in unspecified hand: Secondary | ICD-10-CM | POA: Diagnosis not present

## 2021-03-29 ENCOUNTER — Other Ambulatory Visit: Payer: Self-pay | Admitting: Gastroenterology

## 2021-03-29 DIAGNOSIS — K2970 Gastritis, unspecified, without bleeding: Secondary | ICD-10-CM

## 2021-04-01 ENCOUNTER — Other Ambulatory Visit: Payer: Self-pay | Admitting: Obstetrics and Gynecology

## 2021-04-01 DIAGNOSIS — Z1231 Encounter for screening mammogram for malignant neoplasm of breast: Secondary | ICD-10-CM

## 2021-04-27 ENCOUNTER — Encounter: Payer: Self-pay | Admitting: Gastroenterology

## 2021-04-27 ENCOUNTER — Ambulatory Visit: Payer: BC Managed Care – PPO | Admitting: Gastroenterology

## 2021-04-27 VITALS — BP 100/64 | HR 80 | Ht 62.0 in | Wt 112.0 lb

## 2021-04-27 DIAGNOSIS — R197 Diarrhea, unspecified: Secondary | ICD-10-CM

## 2021-04-27 DIAGNOSIS — K2970 Gastritis, unspecified, without bleeding: Secondary | ICD-10-CM

## 2021-04-27 DIAGNOSIS — K625 Hemorrhage of anus and rectum: Secondary | ICD-10-CM | POA: Diagnosis not present

## 2021-04-27 DIAGNOSIS — K2930 Chronic superficial gastritis without bleeding: Secondary | ICD-10-CM

## 2021-04-27 MED ORDER — PANTOPRAZOLE SODIUM 40 MG PO TBEC: 40.0000 mg | DELAYED_RELEASE_TABLET | Freq: Every day | ORAL | 3 refills | Status: DC

## 2021-04-27 NOTE — Patient Instructions (Addendum)
You can use over the counter preparation H suppositories coated in 1% hydrocortisone cream daily x 5 days when you are having hemorrhoid symptoms.   We have sent the following medications to your pharmacy for you to pick up at your convenience: pantoprazole.  Normal BMI (Body Mass Index- based on height and weight) is between 19 and 25. Your BMI today is Body mass index is 20.49 kg/m. [name] Please consider follow up  regarding your BMI with your Primary Care Provider.  The [facility] providers would like to encourage you to use [facility] to communicate with providers for non-urgent requests or questions.  Due to long hold times on the telephone, sending your provider a message by [facility] may be a faster and more efficient way to get a response.  Please allow 48 business hours for a response.  Please remember that this is for non-urgent requests.   Thank you for choosing me and [facility].  Pricilla Riffle. Dagoberto Ligas., MD., Marval Regal

## 2021-04-27 NOTE — Progress Notes (Addendum)
    History of Present Illness: This is a 55 year old female here today for gastritis, diarrhea and rectal bleeding.  She relates she has had resolution of her epigastric pain and right upper quadrant pain while remaining on pantoprazole.  She discontinued glycopyrrolate because it led to a dry mouth.  She has a intermittent looser stools sometimes 2-3 times per day that alternate with soft bowel movements once or twice per day.  She has several food intolerances that clearly loose in her stools and she tries to avoid these foods.  She has occasional anal discomfort and occasional small-volume rectal bleeding with bowel movements.  These symptoms tend to flare when she is having looser more frequent stools.  She has used Preparation H suppositories which have been effective in reducing her symptoms.   EGD 03/2020 - Normal esophagus. - Erythematous mucosa in the stomach. Biopsied. Mild chronic gastritis.  - Multiple gastric polyps. Biopsied. Fundic gland polyps.  - Normal duodenal bulb and second portion of the duodenum.  Colonoscopy 04/2016 - Internal hemorrhoids were found during retroflexion. The hemorrhoids were small and Grade I (internal hemorrhoids that do not prolapse). - A few small-mouthed diverticula were found in the sigmoid colon. - The exam was otherwise without abnormality on direct and retroflexion views.   Current Medications, Allergies, Past Medical History, Past Surgical History, Family History and Social History were reviewed in [employer] record.   Physical Exam: General: Well developed, well nourished, no acute distress Head: Normocephalic and atraumatic Eyes: Sclerae anicteric, EOMI Ears: Normal auditory acuity Mouth: Not examined, mask on during Covid-19 pandemic Lungs: Clear throughout to auscultation Heart: Regular rate and rhythm; no murmurs, rubs or bruits Abdomen: Soft, non tender and non distended. No masses, hepatosplenomegaly or hernias  noted. Normal Bowel sounds Rectal: Small external tags, no other lesions, no tenderness, heme neg stool  Musculoskeletal: Symmetrical with no gross deformities  Pulses:  Normal pulses noted Extremities: No clubbing, cyanosis, edema or deformities noted Neurological: Alert oriented x 4, grossly nonfocal Psychological:  Alert and cooperative. Normal mood and affect   Assessment and Recommendations:  Internal hemorrhoids. Prep H supp coated with 1% hydrocortisone cream PR qd x 5d when symptoms are active. Call if symptoms are not well controlled.  Chronic gastritis. Continue pantoprazole 40 mg po qd. ERV in 1 year. Intermittent diarrhea. Avoid foods that trigger symptoms.  Imodium twice daily as needed. CRC screening, average risk. A 10 year interval screening colonoscopy is recommended in July 2027.

## 2021-05-21 ENCOUNTER — Ambulatory Visit
Admission: RE | Admit: 2021-05-21 | Discharge: 2021-05-21 | Disposition: A | Discharge status: Home / Self Care | Payer: BC Managed Care – PPO | Source: Ambulatory Visit | Attending: Obstetrics and Gynecology | Admitting: Obstetrics and Gynecology

## 2021-05-21 ENCOUNTER — Other Ambulatory Visit: Payer: Self-pay

## 2021-05-21 DIAGNOSIS — Z1231 Encounter for screening mammogram for malignant neoplasm of breast: Secondary | ICD-10-CM

## 2021-06-01 DIAGNOSIS — M2550 Pain in unspecified joint: Secondary | ICD-10-CM | POA: Diagnosis not present

## 2021-06-01 DIAGNOSIS — M79643 Pain in unspecified hand: Secondary | ICD-10-CM | POA: Diagnosis not present

## 2021-06-01 DIAGNOSIS — I7300 Raynaud's syndrome without gangrene: Secondary | ICD-10-CM | POA: Diagnosis not present

## 2021-06-01 DIAGNOSIS — M1990 Unspecified osteoarthritis, unspecified site: Secondary | ICD-10-CM | POA: Diagnosis not present

## 2021-06-01 DIAGNOSIS — Z79899 Other long term (current) drug therapy: Secondary | ICD-10-CM | POA: Diagnosis not present

## 2021-06-01 DIAGNOSIS — R21 Rash and other nonspecific skin eruption: Secondary | ICD-10-CM | POA: Diagnosis not present

## 2021-06-01 LAB — HEPATITIS B SURFACE ANTIGEN: Hepatitis B Surface Ag: NONREACTIVE

## 2021-06-02 LAB — HM HEPATITIS C SCREENING LAB: HM Hepatitis Screen: NEGATIVE

## 2021-06-28 ENCOUNTER — Telehealth: Payer: Self-pay

## 2021-06-28 NOTE — Telephone Encounter (Signed)
Patient is calling in stating she has been having fatigue for months and nausea. Has an appointment tomorrow for virtual but wanted to know what she should do in the meantime.

## 2021-06-28 NOTE — Telephone Encounter (Signed)
Spoke to pt told her calling about message. Pt said she was called and appointment was changed to in office. Told her yes, Christina [name] wanted to see you so she can order labs and exam you. Pt verbalized understanding.

## 2021-06-29 ENCOUNTER — Encounter: Payer: Self-pay | Admitting: Physician Assistant

## 2021-06-29 ENCOUNTER — Ambulatory Visit (INDEPENDENT_AMBULATORY_CARE_PROVIDER_SITE_OTHER): Payer: BC Managed Care – PPO | Admitting: Physician Assistant

## 2021-06-29 ENCOUNTER — Other Ambulatory Visit: Payer: Self-pay

## 2021-06-29 VITALS — BP 110/70 | HR 73 | Temp 97.7°F | Ht 62.0 in | Wt 110.0 lb

## 2021-06-29 DIAGNOSIS — R5383 Other fatigue: Secondary | ICD-10-CM | POA: Diagnosis not present

## 2021-06-29 DIAGNOSIS — R110 Nausea: Secondary | ICD-10-CM | POA: Diagnosis not present

## 2021-06-29 DIAGNOSIS — R7982 Elevated C-reactive protein (CRP): Secondary | ICD-10-CM | POA: Insufficient documentation

## 2021-06-29 DIAGNOSIS — I7300 Raynaud's syndrome without gangrene: Secondary | ICD-10-CM | POA: Insufficient documentation

## 2021-06-29 DIAGNOSIS — R634 Abnormal weight loss: Secondary | ICD-10-CM

## 2021-06-29 DIAGNOSIS — A630 Anogenital (venereal) warts: Secondary | ICD-10-CM | POA: Insufficient documentation

## 2021-06-29 DIAGNOSIS — M1990 Unspecified osteoarthritis, unspecified site: Secondary | ICD-10-CM | POA: Insufficient documentation

## 2021-06-29 DIAGNOSIS — M159 Polyosteoarthritis, unspecified: Secondary | ICD-10-CM | POA: Insufficient documentation

## 2021-06-29 DIAGNOSIS — M8580 Other specified disorders of bone density and structure, unspecified site: Secondary | ICD-10-CM | POA: Insufficient documentation

## 2021-06-29 LAB — VITAMIN D 25 HYDROXY (VIT D DEFICIENCY, FRACTURES): VITD: 67.32 ng/mL (ref 30.00–100.00)

## 2021-06-29 LAB — COMPREHENSIVE METABOLIC PANEL
ALT: 13 U/L (ref 0–35)
AST: 20 U/L (ref 0–37)
Albumin: 4.4 g/dL (ref 3.5–5.2)
Alkaline Phosphatase: 56 U/L (ref 39–117)
BUN: 14 mg/dL (ref 6–23)
CO2: 28 mEq/L (ref 19–32)
Calcium: 9.3 mg/dL (ref 8.4–10.5)
Chloride: 102 mEq/L (ref 96–112)
Creatinine, Ser: 0.66 mg/dL (ref 0.40–1.20)
GFR: 97.69 mL/min (ref >60.00)
Glucose, Bld: 79 mg/dL (ref 70–99)
Potassium: 3.9 mEq/L (ref 3.5–5.1)
Sodium: 139 mEq/L (ref 135–145)
Total Bilirubin: 0.3 mg/dL (ref 0.2–1.2)
Total Protein: 6.6 g/dL (ref 6.0–8.3)

## 2021-06-29 LAB — CBC WITH DIFFERENTIAL/PLATELET
Basophils Absolute: 0 10*3/uL (ref 0.0–0.1)
Basophils Relative: 0.2 % (ref 0.0–3.0)
Eosinophils Absolute: 0.1 10*3/uL (ref 0.0–0.7)
Eosinophils Relative: 1.9 % (ref 0.0–5.0)
HCT: 43.9 % (ref 36.0–46.0)
Hemoglobin: 14.6 g/dL (ref 12.0–15.0)
Lymphocytes Relative: 28.3 % (ref 12.0–46.0)
Lymphs Abs: 1.5 10*3/uL (ref 0.7–4.0)
MCHC: 33.3 g/dL (ref 30.0–36.0)
MCV: 92.6 fl (ref 78.0–100.0)
Monocytes Absolute: 0.4 10*3/uL (ref 0.1–1.0)
Monocytes Relative: 7 % (ref 3.0–12.0)
Neutro Abs: 3.4 10*3/uL (ref 1.4–7.7)
Neutrophils Relative %: 62.6 % (ref 43.0–77.0)
Platelets: 223 10*3/uL (ref 150.0–400.0)
RBC: 4.74 Mil/uL (ref 3.87–5.11)
RDW: 12.4 % (ref 11.5–15.5)
WBC: 5.4 10*3/uL (ref 4.0–10.5)

## 2021-06-29 LAB — TSH: TSH: 2.78 u[IU]/mL (ref 0.35–5.50)

## 2021-06-29 LAB — LIPASE: Lipase: 58 U/L (ref 11.0–59.0)

## 2021-06-29 LAB — VITAMIN B12: Vitamin B-12: 611 pg/mL (ref 211–911)

## 2021-06-29 LAB — HM HIV SCREENING LAB: HM HIV Screening: NEGATIVE

## 2021-06-29 MED ORDER — ONDANSETRON 4 MG PO TBDP
4.0000 mg | ORAL_TABLET | Freq: Three times a day (TID) | ORAL | 1 refills | Status: DC | PRN
Start: 2021-06-29 — End: 2023-01-25

## 2021-06-29 NOTE — Patient Instructions (Signed)
It was great to see you!  Continue protonix Trial zofran as needed for nausea Trial bland diet foods (see handout)  Update blood work and urine tests today  I will be in touch with a plan and when to follow-up when I get all of your results back  Take care,  Inda Coke PA-C

## 2021-06-29 NOTE — Addendum Note (Signed)
Addended by: Loura Back on: 06/29/2021 01:59 PM   Modules accepted: Orders

## 2021-06-29 NOTE — Progress Notes (Signed)
Christina [name] is a 55 y.o. female here for a new problem.  I acted as a Education administrator for [employer], PA-C Anselmo Pickler, LPN   History of Present Illness:   Chief Complaint  Patient presents with   Fatigue   Nausea    HPI  Had COVID for the second time in June 2022  Fatigue Pt c/o fatigue several weeks, gets worn out with activity. Denies: unusual cough, CP, SOB. Pt is having joint pain that is being evaluated by her rheumatologist to see if she has Sjogrens syndrome. She is currently on plaquenil for raynauds and her inflammatory arthritis. Considering starting MTX because plaquenil is not helping. Sleeping well, denies concerns for sleep apnea.  Works as a Art therapist for 36 years. Has significant bilateral hand pain after working. Works 4 days per week but thinking of going down to 3 days per week to help with her symptoms. Gets adequate sleep.  No LMP recorded. Patient is perimenopausal.  UTD with colonoscopy. Saw Dr. Fuller Plan as recently as July 2022 -- has not had an increase in rectal bleeding since seeing him. PAP smear scheduled for next month. Mammogram UTD.  Has had about 7 lb of unintentional weight loss since 2019.    Wt Readings from Last 10 Encounters:  06/29/21 110 lb (49.9 kg)  04/27/21 112 lb (50.8 kg)  04/30/20 111 lb (50.3 kg)  04/03/20 113 lb (51.3 kg)  03/26/20 113 lb 6.4 oz (51.4 kg)  03/23/20 112 lb 8 oz (51 kg)  03/05/20 114 lb (51.7 kg)  08/30/19 112 lb 3.2 oz (50.9 kg)  06/08/18 116 lb (52.6 kg)  11/07/17 117 lb 12.8 oz (53.4 kg)    Nausea Pt has been having nausea off and on for the past few days. Pt said yesterday had nausea and diarrhea.  She has to force herself to eat foods due to her nausea. She is sipping on flat gingerale and eating crackers, bananas. She can tell she has slight weight loss. Follows an anti-inflammatory diet.  Denies issues with depression and anxiety. Does have known chronic gastritis.   Past Medical History:   Diagnosis Date   Generalized headaches    Migraine    Situational anxiety    related to daughters death     Social History   Tobacco Use   Smoking status: Never   Smokeless tobacco: Never  Vaping Use   Vaping Use: Never used  Substance Use Topics   Alcohol use: No    Alcohol/week: 0.0 standard drinks   Drug use: No    Past Surgical History:  Procedure Laterality Date   BREAST EXCISIONAL BIOPSY Right 2019   sclerosing lesion, adenosis, fibrocystic changes   BREAST LUMPECTOMY WITH RADIOACTIVE SEED LOCALIZATION Right 06/08/2018   Procedure: RIGHT BREAST LUMPECTOMY WITH RADIOACTIVE SEED LOCALIZATION;  Surgeon: Coralie Keens, MD;  Location: [facility];  Service: General;  Laterality: Right;   LAPAROSCOPIC APPENDECTOMY  02/16/2012   Procedure: APPENDECTOMY LAPAROSCOPIC;  Surgeon: Harl Bowie, MD;  Location: [facility];  Service: General;  Laterality: N/A;   TONSILLECTOMY     TUBAL LIGATION     WISDOM TOOTH EXTRACTION      Family History  Problem Relation Age of Onset   Heart disease Mother    Stroke Mother    Heart attack Mother    Kidney failure Mother    Cancer Father        prostate   Parkinson's disease Father    Colon cancer Neg  Hx    Esophageal cancer Neg Hx    Rectal cancer Neg Hx    Stomach cancer Neg Hx     Allergies  Allergen Reactions   Amoxicillin-Pot Clavulanate Other (See Comments)    Current Medications:   Current Outpatient Medications:    Cholecalciferol (VITAMIN D-3) 25 MCG (1000 UT) CAPS, Take 1 capsule by mouth daily., Disp: , Rfl:    Collagen Hydrolysate POWD, 1 Scoop by Does not apply route daily., Disp: , Rfl:    CRANBERRY PO, Take 2 tablets by mouth daily in the afternoon., Disp: , Rfl:    hydroxychloroquine (PLAQUENIL) 200 MG tablet, Take 150 mg by mouth daily., Disp: , Rfl:    Lysine 500 MG TABS, Take 500 mg by mouth daily. , Disp: , Rfl:    Multiple Vitamins-Minerals (MULTIVITAMIN WITH MINERALS) tablet, Take 1 tablet by mouth daily. ,  Disp: , Rfl:    ondansetron (ZOFRAN ODT) 4 MG disintegrating tablet, Take 1 tablet (4 mg total) by mouth every 8 (eight) hours as needed for nausea or vomiting., Disp: 30 tablet, Rfl: 1   pantoprazole (PROTONIX) 40 MG tablet, Take 1 tablet (40 mg total) by mouth daily., Disp: 90 tablet, Rfl: 3   Review of Systems:   ROS Negative unless otherwise specified per HPI.  Vitals:   Vitals:   06/29/21 1307  BP: 110/70  Pulse: 73  Temp: 97.7 F (36.5 C)  TempSrc: Temporal  SpO2: 96%  Weight: 110 lb (49.9 kg)  Height: 5\' 2"  (1.575 m)     Body mass index is 20.12 kg/m.  Physical Exam:   Physical Exam Vitals and nursing note reviewed.  Constitutional:      General: She is not in acute distress.    Appearance: She is well-developed. She is not ill-appearing or toxic-appearing.  Cardiovascular:     Rate and Rhythm: Normal rate and regular rhythm.     Pulses: Normal pulses.     Heart sounds: Normal heart sounds, S1 normal and S2 normal.  Pulmonary:     Effort: Pulmonary effort is normal.     Breath sounds: Normal breath sounds.  Abdominal:     General: Abdomen is flat. Bowel sounds are normal.     Palpations: Abdomen is soft.     Tenderness: There is abdominal tenderness in the epigastric area. There is no right CVA tenderness or left CVA tenderness.  Skin:    General: Skin is warm and dry.  Neurological:     Mental Status: She is alert.     GCS: GCS eye subscore is 4. GCS verbal subscore is 5. GCS motor subscore is 6.  Psychiatric:        Speech: Speech normal.        Behavior: Behavior normal. Behavior is cooperative.    Assessment and Plan:   Fatigue, unspecified type Unclear etiology, however likely multifactorial Does have significant joint pain and is undergoing thorough rheumatology work-up Will update appropriate initial blood work for fatigue today If normal, consider chest xray and additional blood work as necessary  Nausea Does have chronic  gastritis Continue protonix per GI Refilled zofran to use prn Update blood work Close follow-up with GI if symptoms persist or worsen  Unintended weight loss Blood work as discussed above Handout on bland diet provided Recommend follow-up in 1-3 months for weight recheck May have to pursue abdominal/chest imaging if persists  CMA or LPN served as Education administrator during this visit. History, Physical, and Plan performed by medical  provider. The above documentation has been reviewed and is accurate and complete.  Time spent with patient today was 45 minutes which consisted of chart review, discussing diagnosis, work up, treatment answering questions and documentation.   Inda Coke, PA-C

## 2021-06-30 LAB — IRON,TIBC AND FERRITIN PANEL
%SAT: 16 % (calc) (ref 16–45)
Ferritin: 101 ng/mL (ref 16–232)
Iron: 37 ug/dL — ABNORMAL LOW (ref 45–160)
TIBC: 233 mcg/dL (calc) — ABNORMAL LOW (ref 250–450)

## 2021-06-30 LAB — URINALYSIS, ROUTINE W REFLEX MICROSCOPIC
Bilirubin Urine: NEGATIVE
Hgb urine dipstick: NEGATIVE
Ketones, ur: NEGATIVE
Leukocytes,Ua: NEGATIVE
Nitrite: NEGATIVE
Specific Gravity, Urine: 1.025 (ref 1.000–1.030)
Total Protein, Urine: NEGATIVE
Urine Glucose: NEGATIVE
Urobilinogen, UA: 0.2 — AB (ref 0.0–1.0)
pH: 6 (ref 5.0–8.0)

## 2021-06-30 LAB — HIV ANTIBODY (ROUTINE TESTING W REFLEX): HIV 1&2 Ab, 4th Generation: NONREACTIVE

## 2021-07-01 ENCOUNTER — Encounter: Payer: Self-pay | Admitting: Physician Assistant

## 2021-07-01 ENCOUNTER — Telehealth: Payer: Self-pay

## 2021-07-01 NOTE — Telephone Encounter (Signed)
Patient is calling in stating that she is confused about what imaging Sam is going to order and would like a call back.

## 2021-07-02 NOTE — Telephone Encounter (Signed)
See MyChart message

## 2021-07-05 ENCOUNTER — Telehealth: Payer: Self-pay | Admitting: Gastroenterology

## 2021-07-05 NOTE — Telephone Encounter (Signed)
Patient called to let Dr. Fuller Plan know that she will be starting a Methotrexate medication recommended by Rheumatologist wasn't sure if that is ok with her stomach issues.

## 2021-07-05 NOTE — Telephone Encounter (Signed)
Dr. Fuller Plan any contraindications from GI standpoint?

## 2021-07-05 NOTE — Telephone Encounter (Signed)
Patient notified

## 2021-07-05 NOTE — Telephone Encounter (Signed)
Her GI problems should not be impacted by MTX.

## 2021-07-07 DIAGNOSIS — L218 Other seborrheic dermatitis: Secondary | ICD-10-CM | POA: Diagnosis not present

## 2021-07-09 DIAGNOSIS — M1990 Unspecified osteoarthritis, unspecified site: Secondary | ICD-10-CM | POA: Diagnosis not present

## 2021-07-09 DIAGNOSIS — Z79899 Other long term (current) drug therapy: Secondary | ICD-10-CM | POA: Diagnosis not present

## 2021-07-09 DIAGNOSIS — M0609 Rheumatoid arthritis without rheumatoid factor, multiple sites: Secondary | ICD-10-CM | POA: Diagnosis not present

## 2021-07-09 DIAGNOSIS — I7300 Raynaud's syndrome without gangrene: Secondary | ICD-10-CM | POA: Diagnosis not present

## 2021-07-13 DIAGNOSIS — Z01419 Encounter for gynecological examination (general) (routine) without abnormal findings: Secondary | ICD-10-CM | POA: Diagnosis not present

## 2021-07-13 DIAGNOSIS — Z6820 Body mass index (BMI) 20.0-20.9, adult: Secondary | ICD-10-CM | POA: Diagnosis not present

## 2021-07-14 ENCOUNTER — Encounter: Payer: Self-pay | Admitting: Physician Assistant

## 2021-07-14 LAB — QUANTIFERON TB GOLD ASSAY (BLOOD): QUANTIFERON TB GOLD: NEGATIVE

## 2021-07-20 DIAGNOSIS — E785 Hyperlipidemia, unspecified: Secondary | ICD-10-CM | POA: Diagnosis not present

## 2021-08-11 DIAGNOSIS — M0609 Rheumatoid arthritis without rheumatoid factor, multiple sites: Secondary | ICD-10-CM | POA: Diagnosis not present

## 2021-08-11 DIAGNOSIS — R768 Other specified abnormal immunological findings in serum: Secondary | ICD-10-CM | POA: Diagnosis not present

## 2021-08-11 DIAGNOSIS — M1990 Unspecified osteoarthritis, unspecified site: Secondary | ICD-10-CM | POA: Diagnosis not present

## 2021-08-11 DIAGNOSIS — Z79899 Other long term (current) drug therapy: Secondary | ICD-10-CM | POA: Diagnosis not present

## 2021-08-12 LAB — CBC: RBC: 4.48 (ref 3.87–5.11)

## 2021-08-12 LAB — HEPATIC FUNCTION PANEL
ALT: 12 (ref 7–35)
AST: 17 (ref 13–35)
Alkaline Phosphatase: 68 (ref 25–125)
Bilirubin, Total: 0.2

## 2021-08-12 LAB — CBC AND DIFFERENTIAL
HCT: 42 (ref 36–46)
Hemoglobin: 13.6 (ref 12.0–16.0)
Neutrophils Absolute: 3.9
Platelets: 265 (ref 150–399)
WBC: 7.6

## 2021-08-12 LAB — COMPREHENSIVE METABOLIC PANEL
Albumin: 4.7 (ref 3.5–5.0)
Calcium: 9.7 (ref 8.7–10.7)
GFR calc non Af Amer: 98

## 2021-08-12 LAB — BASIC METABOLIC PANEL
BUN: 19 (ref 4–21)
CO2: 28 — AB (ref 13–22)
Chloride: 103 (ref 99–108)
Creatinine: 0.7 (ref 0.5–1.1)
Glucose: 80
Potassium: 4.8 (ref 3.4–5.3)
Sodium: 140 (ref 137–147)

## 2021-08-12 LAB — POCT ERYTHROCYTE SEDIMENTATION RATE, NON-AUTOMATED: Sed Rate: 2

## 2021-08-19 ENCOUNTER — Encounter: Payer: Self-pay | Admitting: Physician Assistant

## 2021-09-13 DIAGNOSIS — W57XXXA Bitten or stung by nonvenomous insect and other nonvenomous arthropods, initial encounter: Secondary | ICD-10-CM | POA: Diagnosis not present

## 2021-09-13 DIAGNOSIS — R030 Elevated blood-pressure reading, without diagnosis of hypertension: Secondary | ICD-10-CM | POA: Diagnosis not present

## 2021-09-13 DIAGNOSIS — Z681 Body mass index (BMI) 19 or less, adult: Secondary | ICD-10-CM | POA: Diagnosis not present

## 2021-09-13 DIAGNOSIS — Z23 Encounter for immunization: Secondary | ICD-10-CM | POA: Diagnosis not present

## 2021-09-22 DIAGNOSIS — M0609 Rheumatoid arthritis without rheumatoid factor, multiple sites: Secondary | ICD-10-CM | POA: Diagnosis not present

## 2021-09-22 DIAGNOSIS — Z79899 Other long term (current) drug therapy: Secondary | ICD-10-CM | POA: Diagnosis not present

## 2021-09-22 DIAGNOSIS — M1990 Unspecified osteoarthritis, unspecified site: Secondary | ICD-10-CM | POA: Diagnosis not present

## 2021-09-22 DIAGNOSIS — R768 Other specified abnormal immunological findings in serum: Secondary | ICD-10-CM | POA: Diagnosis not present

## 2021-09-22 LAB — HEPATIC FUNCTION PANEL
ALT: 18 U/L (ref 7–35)
AST: 19 (ref 13–35)
Alkaline Phosphatase: 70 (ref 25–125)
Bilirubin, Total: 0.2

## 2021-09-22 LAB — BASIC METABOLIC PANEL
BUN: 16 (ref 4–21)
CO2: 30 — AB (ref 13–22)
Chloride: 102 (ref 99–108)
Creatinine: 0.6 (ref 0.5–1.1)
Glucose: 92
Potassium: 3.9 mEq/L (ref 3.5–5.1)
Sodium: 140 (ref 137–147)

## 2021-09-22 LAB — CBC AND DIFFERENTIAL
HCT: 42 (ref 36–46)
Hemoglobin: 13.9 (ref 12.0–16.0)
Platelets: 293 10*3/uL (ref 150–400)
WBC: 8.8

## 2021-09-22 LAB — COMPREHENSIVE METABOLIC PANEL
Albumin: 4.3 (ref 3.5–5.0)
Globulin: 2.6
eGFR: 99

## 2021-09-22 LAB — CBC: RBC: 4.45 (ref 3.87–5.11)

## 2021-09-27 DIAGNOSIS — D2262 Melanocytic nevi of left upper limb, including shoulder: Secondary | ICD-10-CM | POA: Diagnosis not present

## 2021-09-27 DIAGNOSIS — D2261 Melanocytic nevi of right upper limb, including shoulder: Secondary | ICD-10-CM | POA: Diagnosis not present

## 2021-09-27 DIAGNOSIS — L570 Actinic keratosis: Secondary | ICD-10-CM | POA: Diagnosis not present

## 2021-09-27 DIAGNOSIS — L438 Other lichen planus: Secondary | ICD-10-CM | POA: Diagnosis not present

## 2021-12-21 DIAGNOSIS — M1990 Unspecified osteoarthritis, unspecified site: Secondary | ICD-10-CM | POA: Diagnosis not present

## 2021-12-21 DIAGNOSIS — R768 Other specified abnormal immunological findings in serum: Secondary | ICD-10-CM | POA: Diagnosis not present

## 2021-12-21 DIAGNOSIS — M0609 Rheumatoid arthritis without rheumatoid factor, multiple sites: Secondary | ICD-10-CM | POA: Diagnosis not present

## 2021-12-21 DIAGNOSIS — Z79899 Other long term (current) drug therapy: Secondary | ICD-10-CM | POA: Diagnosis not present

## 2021-12-29 ENCOUNTER — Encounter: Payer: Self-pay | Admitting: Physician Assistant

## 2022-03-23 DIAGNOSIS — M1990 Unspecified osteoarthritis, unspecified site: Secondary | ICD-10-CM | POA: Diagnosis not present

## 2022-03-23 DIAGNOSIS — M0609 Rheumatoid arthritis without rheumatoid factor, multiple sites: Secondary | ICD-10-CM | POA: Diagnosis not present

## 2022-03-23 DIAGNOSIS — Z79899 Other long term (current) drug therapy: Secondary | ICD-10-CM | POA: Diagnosis not present

## 2022-03-23 DIAGNOSIS — R768 Other specified abnormal immunological findings in serum: Secondary | ICD-10-CM | POA: Diagnosis not present

## 2022-03-25 ENCOUNTER — Ambulatory Visit (INDEPENDENT_AMBULATORY_CARE_PROVIDER_SITE_OTHER): Payer: BC Managed Care – PPO | Admitting: Family Medicine

## 2022-03-25 VITALS — BP 114/72 | HR 62 | Temp 97.7°F | Ht 62.0 in | Wt 112.8 lb

## 2022-03-25 DIAGNOSIS — M0609 Rheumatoid arthritis without rheumatoid factor, multiple sites: Secondary | ICD-10-CM | POA: Insufficient documentation

## 2022-03-25 DIAGNOSIS — B009 Herpesviral infection, unspecified: Secondary | ICD-10-CM

## 2022-03-25 MED ORDER — VALACYCLOVIR HCL 1 G PO TABS: 2000.0000 mg | ORAL_TABLET | Freq: Two times a day (BID) | ORAL | 0 refills | Status: DC

## 2022-03-25 NOTE — Patient Instructions (Signed)
Cold Sore  A cold sore, also called a fever blister, is a small, fluid-filled sore that forms inside the mouth or on the lips, gums, nose, chin, or cheeks. Cold sores can spread to other parts of the body, such as the eyes, fingers, or genitals. Cold sores can spread from person to person (are contagious) until the sores crust over completely. Most cold sores go away within 2 weeks. What are the causes? Cold sores are caused by an infection from a common type of herpes simplex virus (HSV-1). HSV-1 is closely related to the HSV-2virus, which is the virus that causes genital herpes, but these viruses are not the same. Once a person is infected with HSV-1, the virus remains permanently in the body. HSV-1 is spread from person to person through close contact, such as through kissing, touching the affected area, or sharing personal items such as lip balm, razors, a drinking glass, or eating utensils. What increases the risk? You are more likely to develop this condition if you: Are tired, stressed, or sick. Are menstruating. Are pregnant. Take certain medicines. Are exposed to cold weather or too much sun. What are the signs or symptoms? Symptoms of a cold sore outbreak go through different stages. These are the stages of a cold sore: Tingling, itching, or burning is felt 1-2 days before the outbreak. Fluid-filled blisters appear on the lips, inside the mouth, on the nose, or on the cheeks. The blisters start to ooze clear fluid. The blisters dry up, and a yellow crust appears in their place. The crust falls off. In some cases, other symptoms can develop during a cold sore outbreak. These can include: Fever. Sore throat. Headache. Muscle aches. Swollen neck glands. How is this diagnosed? This condition is diagnosed based on your medical history and a physical exam. Your health care provider may do a blood test or may swab some fluid from your sore and then examine the swab in the lab. How is  this treated? There is no cure for cold sores or HSV-1. There is also no vaccine for HSV-1. Most cold sores go away on their own without treatment within 2 weeks. Medicines cannot make the infection go away, but your health care provider may prescribe medicines to: Help relieve some of the pain associated with the sores. Work to stop the virus from multiplying. Shorten healing time. Medicines may be in the form of creams, gels, pills, or a shot. Follow these instructions at home: Medicines Take or apply over-the-counter and prescription medicines only as told by your health care provider. Use a cotton-tip swab to apply creams or gels to your sores. Ask your health care provider if you can take lysine supplements. Research has found that lysine may help heal the cold sore faster and prevent outbreaks. Sore care  Do not touch the sores or pick the scabs. Wash your hands often with soap and water for at least 20 seconds. Do not touch your eyes without washing your hands first. Keep the sores clean and dry. If directed, put ice on the sores. To do this: Put ice in a plastic bag. Place a towel between your skin and the bag. Leave the ice on for 20 minutes, 2-3 times a day. Remove the ice if your skin turns bright red. This is very important. If you cannot feel pain, heat, or cold, you have a greater risk of damage to the area. Eating and drinking Eat a soft, bland diet. Avoid eating hot, cold, or salty foods.   Use a straw if it hurts to drink out of a glass. Eat foods that are rich in lysine, such as meat, fish, and dairy products. Avoid sugary foods, chocolates, nuts, and grains. These foods are rich in a nutrient called arginine, which can cause the virus to multiply. Lifestyle Do not kiss, have oral sex, or share personal items until your sores heal. Stress, poor sleep, and being out in the sun can trigger outbreaks. Make sure you: Do activities that help you relax, such as deep breathing  exercises or meditation. Get enough sleep. Apply sunscreen on your lips before you go out in the sun. Contact a health care provider if: You have symptoms for more than 2 weeks. You have pus coming from the sores. You have redness that is spreading. You have pain or irritation in your eye. You get sores on your genitals. Your sores do not heal within 2 weeks. You have frequent cold sore outbreaks. Get help right away if: You have a fever and your symptoms suddenly get worse. You have a headache and confusion. You have tiredness (fatigue) or loss of appetite. You have a stiff neck or sensitivity to light. Summary A cold sore, also called a fever blister, is a small, fluid-filled sore that forms inside the mouth or on the lips, gums, nose, chin, or cheeks. Most cold sores go away on their own without treatment within 2 weeks. Your health care provider may prescribe medicines to help relieve some of the pain, work to stop the virus from multiplying, and shorten healing time. Wash your hands often with soap and water for at least 20 seconds. Do not touch your eyes without washing your hands first. Do not kiss, have oral sex, or share personal items until your sores heal. Contact a health care provider if your sores do not heal within 2 weeks. This information is not intended to replace advice given to you by your health care provider. Make sure you discuss any questions you have with your health care provider. Document Revised: 07/07/2021 Document Reviewed: 07/07/2021 Elsevier Patient Education  2023 [employer] Inc.  

## 2022-03-25 NOTE — Progress Notes (Signed)
[facility] [address] [place] [address] Dept: [phone] Dept Fax: [phone]  Office Visit  Subjective:    Patient ID: Christina [name], female    DOB: [birth year], 56 y.o..   MRN: [id number]  Chief Complaint  Patient presents with   Acute Visit    C/o having a fever blister x 4 weeks. Has used the Abreva.     History of Present Illness:  Patient is in today for evaluation of a lingering fever blister. She notes this has been present for about 4 weeks. She has had fever blisters intermittently for years, but never one that last so long. She notes this has been different also, as she had a small ridge of redness along the lip margin near the lesion. She also has noted a small bump closer to the nose that she felt was acne ike, but had not resolved. 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 She is managed on methotrexate and hydroxychloroquine. She has been using Abreva on the fever blister. She notes she did have typical tingling/burning along the V2 distribution on the right. She went to see her dentist and had a Panorex x-ray done. There was no sign of dental abscess or sinus infeciton.  Past Medical History: Patient Active Problem List   Diagnosis Date Noted   Rheumatoid arthritis of multiple sites without rheumatoid factor ([place]) 03/25/2022   Elevated C-reactive protein 06/29/2021   Generalized osteoarthritis of hand 06/29/2021   Osteopenia 06/29/2021   Raynaud's phenomenon 06/29/2021   TMJ pain dysfunction syndrome 51/76/1607   Umbilical pain at incsion, prob muscle strain 03/22/2012   Past Surgical History:  Procedure Laterality Date   BREAST EXCISIONAL BIOPSY Right 2019   sclerosing lesion, adenosis, fibrocystic changes   BREAST LUMPECTOMY WITH RADIOACTIVE SEED LOCALIZATION Right 06/08/2018   Procedure: RIGHT BREAST LUMPECTOMY WITH RADIOACTIVE SEED LOCALIZATION;   Surgeon: Coralie Keens, MD;  Location: [facility];  Service: General;  Laterality: Right;   LAPAROSCOPIC APPENDECTOMY  02/16/2012   Procedure: APPENDECTOMY LAPAROSCOPIC;  Surgeon: Harl Bowie, MD;  Location: [facility];  Service: General;  Laterality: N/A;   TONSILLECTOMY     TUBAL LIGATION     WISDOM TOOTH EXTRACTION     Family History  Problem Relation Age of Onset   Heart disease Mother    Stroke Mother    Heart attack Mother    Kidney failure Mother    Cancer Father        prostate   Parkinson's disease Father    Colon cancer Neg Hx    Esophageal cancer Neg Hx    Rectal cancer Neg Hx    Stomach cancer Neg Hx    Outpatient Medications Prior to Visit  Medication Sig Dispense Refill   acetaminophen (TYLENOL 8 HOUR ARTHRITIS PAIN) 650 MG CR tablet 2 tablets as needed     Cholecalciferol (VITAMIN D-3) 25 MCG (1000 UT) CAPS Take 1 capsule by mouth daily.     Collagen Hydrolysate POWD 1 Scoop by Does not apply route daily.     CRANBERRY PO Take 2 tablets by mouth daily in the afternoon.     folic acid (FOLVITE) 1 MG tablet folic acid 1 mg tablet  TAKE 1 TABLET BY MOUTH ONCE DAILY     hydroxychloroquine (PLAQUENIL) 200 MG tablet Take 150 mg by mouth daily.     Lysine 500 MG TABS Take 500 mg by mouth daily.  methotrexate (RHEUMATREX) 2.5 MG tablet methotrexate sodium 2.5 mg tablet  TAKE 4 TABLETS BY MOUTH ONCE A WEEK AS DIRECTED     Multiple Vitamins-Minerals (MULTIVITAMIN WITH MINERALS) tablet Take 1 tablet by mouth daily.      ondansetron (ZOFRAN ODT) 4 MG disintegrating tablet Take 1 tablet (4 mg total) by mouth every 8 (eight) hours as needed for nausea or vomiting. 30 tablet 1   pantoprazole (PROTONIX) 40 MG tablet Take 1 tablet (40 mg total) by mouth daily. 90 tablet 3   No facility-administered medications prior to visit.   Allergies  Allergen Reactions   Amoxicillin-Pot Clavulanate Other (See Comments)     Objective:   Today's Vitals   03/25/22 0805  BP: 114/72   Pulse: 62  Temp: 97.7 F (36.5 C)  TempSrc: Temporal  SpO2: 98%  Weight: 112 lb 12.8 oz (51.2 kg)  Height: '5\' 2"'$  (1.575 m)   Body mass index is 20.63 kg/m.   General: Well developed, well nourished. No acute distress. HEENT: There is a small vesicular lesion at the vermilion border to the right upper lip. I do appreciate a 4-5 mm   linear ridge along the lip margin near this and a small papule closer to the nose. The papule is not red. Psych: Alert and oriented. Normal mood and affect.  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 The longer course may be secondary to immunosuppression from her MTX and Plaquenil. I will give her a standard course of valacyclovir. I reassured her that there are not drug interactions she need worry about with using this together with her DMARDs. If not improving in 1-2 weeks, she should check with her dermatologist.  - valACYclovir (VALTREX) 1000 MG tablet; Take 2 tablets (2,000 mg total) by mouth 2 (two) times daily.  Dispense: 4 tablet; Refill: 0   Return in about 2 weeks (around 04/08/2022), or if symptoms worsen or fail to improve.   Haydee Salter, MD

## 2022-04-05 DIAGNOSIS — B3783 Candidal cheilitis: Secondary | ICD-10-CM | POA: Diagnosis not present

## 2022-04-05 DIAGNOSIS — K130 Diseases of lips: Secondary | ICD-10-CM | POA: Diagnosis not present

## 2022-04-05 DIAGNOSIS — L570 Actinic keratosis: Secondary | ICD-10-CM | POA: Diagnosis not present

## 2022-04-13 ENCOUNTER — Other Ambulatory Visit: Payer: Self-pay | Admitting: Obstetrics and Gynecology

## 2022-04-13 DIAGNOSIS — Z1231 Encounter for screening mammogram for malignant neoplasm of breast: Secondary | ICD-10-CM

## 2022-05-27 ENCOUNTER — Ambulatory Visit
Admission: RE | Admit: 2022-05-27 | Discharge: 2022-05-27 | Disposition: A | Discharge status: Home / Self Care | Payer: BC Managed Care – PPO | Source: Ambulatory Visit | Attending: Obstetrics and Gynecology | Admitting: Obstetrics and Gynecology

## 2022-05-27 DIAGNOSIS — Z1231 Encounter for screening mammogram for malignant neoplasm of breast: Secondary | ICD-10-CM

## 2022-05-30 ENCOUNTER — Other Ambulatory Visit: Payer: Self-pay | Admitting: Obstetrics and Gynecology

## 2022-05-30 DIAGNOSIS — R928 Other abnormal and inconclusive findings on diagnostic imaging of breast: Secondary | ICD-10-CM

## 2022-05-31 DIAGNOSIS — L570 Actinic keratosis: Secondary | ICD-10-CM | POA: Diagnosis not present

## 2022-06-10 ENCOUNTER — Other Ambulatory Visit: Payer: Self-pay | Admitting: Gastroenterology

## 2022-06-10 DIAGNOSIS — K2970 Gastritis, unspecified, without bleeding: Secondary | ICD-10-CM

## 2022-06-20 ENCOUNTER — Telehealth: Payer: Self-pay | Admitting: Gastroenterology

## 2022-06-20 DIAGNOSIS — K2970 Gastritis, unspecified, without bleeding: Secondary | ICD-10-CM

## 2022-06-20 MED ORDER — PANTOPRAZOLE SODIUM 40 MG PO TBEC: 40.0000 mg | DELAYED_RELEASE_TABLET | Freq: Every day | ORAL | 0 refills | Status: DC

## 2022-06-20 NOTE — Telephone Encounter (Signed)
Prescription sent to patient's pharmacy. Informed patient on script to schedule appt for any further refills.

## 2022-06-20 NOTE — Telephone Encounter (Signed)
Inbound call from patient stating that she is in need of a refill for PROTONIX. Please advise.

## 2022-06-24 ENCOUNTER — Ambulatory Visit: Payer: BC Managed Care – PPO

## 2022-06-24 ENCOUNTER — Ambulatory Visit
Admission: RE | Admit: 2022-06-24 | Discharge: 2022-06-24 | Disposition: A | Discharge status: Home / Self Care | Payer: BC Managed Care – PPO | Source: Ambulatory Visit | Attending: Obstetrics and Gynecology | Admitting: Obstetrics and Gynecology

## 2022-06-24 DIAGNOSIS — R922 Inconclusive mammogram: Secondary | ICD-10-CM | POA: Diagnosis not present

## 2022-06-24 DIAGNOSIS — R928 Other abnormal and inconclusive findings on diagnostic imaging of breast: Secondary | ICD-10-CM

## 2022-06-28 DIAGNOSIS — M0609 Rheumatoid arthritis without rheumatoid factor, multiple sites: Secondary | ICD-10-CM | POA: Diagnosis not present

## 2022-06-28 DIAGNOSIS — R768 Other specified abnormal immunological findings in serum: Secondary | ICD-10-CM | POA: Diagnosis not present

## 2022-06-28 DIAGNOSIS — M1990 Unspecified osteoarthritis, unspecified site: Secondary | ICD-10-CM | POA: Diagnosis not present

## 2022-06-28 DIAGNOSIS — Z79899 Other long term (current) drug therapy: Secondary | ICD-10-CM | POA: Diagnosis not present

## 2022-07-04 ENCOUNTER — Encounter: Payer: Self-pay | Admitting: *Deleted

## 2022-07-19 DIAGNOSIS — Z124 Encounter for screening for malignant neoplasm of cervix: Secondary | ICD-10-CM | POA: Diagnosis not present

## 2022-07-19 DIAGNOSIS — Z1151 Encounter for screening for human papillomavirus (HPV): Secondary | ICD-10-CM | POA: Diagnosis not present

## 2022-07-19 DIAGNOSIS — Z6820 Body mass index (BMI) 20.0-20.9, adult: Secondary | ICD-10-CM | POA: Diagnosis not present

## 2022-07-19 DIAGNOSIS — Z01419 Encounter for gynecological examination (general) (routine) without abnormal findings: Secondary | ICD-10-CM | POA: Diagnosis not present

## 2022-07-25 DIAGNOSIS — Z681 Body mass index (BMI) 19 or less, adult: Secondary | ICD-10-CM | POA: Diagnosis not present

## 2022-07-25 DIAGNOSIS — R059 Cough, unspecified: Secondary | ICD-10-CM | POA: Diagnosis not present

## 2022-07-25 DIAGNOSIS — R509 Fever, unspecified: Secondary | ICD-10-CM | POA: Diagnosis not present

## 2022-07-25 DIAGNOSIS — R030 Elevated blood-pressure reading, without diagnosis of hypertension: Secondary | ICD-10-CM | POA: Diagnosis not present

## 2022-07-25 DIAGNOSIS — Z0130 Encounter for examination of blood pressure without abnormal findings: Secondary | ICD-10-CM | POA: Diagnosis not present

## 2022-08-05 DIAGNOSIS — J40 Bronchitis, not specified as acute or chronic: Secondary | ICD-10-CM | POA: Diagnosis not present

## 2022-08-05 DIAGNOSIS — Z681 Body mass index (BMI) 19 or less, adult: Secondary | ICD-10-CM | POA: Diagnosis not present

## 2022-08-05 DIAGNOSIS — Z0130 Encounter for examination of blood pressure without abnormal findings: Secondary | ICD-10-CM | POA: Diagnosis not present

## 2022-08-16 DIAGNOSIS — Z13220 Encounter for screening for lipoid disorders: Secondary | ICD-10-CM | POA: Diagnosis not present

## 2022-08-16 DIAGNOSIS — Z1321 Encounter for screening for nutritional disorder: Secondary | ICD-10-CM | POA: Diagnosis not present

## 2022-08-16 DIAGNOSIS — N858 Other specified noninflammatory disorders of uterus: Secondary | ICD-10-CM | POA: Diagnosis not present

## 2022-08-16 DIAGNOSIS — Z131 Encounter for screening for diabetes mellitus: Secondary | ICD-10-CM | POA: Diagnosis not present

## 2022-08-16 DIAGNOSIS — N950 Postmenopausal bleeding: Secondary | ICD-10-CM | POA: Diagnosis not present

## 2022-08-16 DIAGNOSIS — Z13228 Encounter for screening for other metabolic disorders: Secondary | ICD-10-CM | POA: Diagnosis not present

## 2022-09-20 DIAGNOSIS — Z79899 Other long term (current) drug therapy: Secondary | ICD-10-CM | POA: Diagnosis not present

## 2022-09-20 DIAGNOSIS — M0609 Rheumatoid arthritis without rheumatoid factor, multiple sites: Secondary | ICD-10-CM | POA: Diagnosis not present

## 2022-09-20 DIAGNOSIS — M1990 Unspecified osteoarthritis, unspecified site: Secondary | ICD-10-CM | POA: Diagnosis not present

## 2022-09-20 DIAGNOSIS — R768 Other specified abnormal immunological findings in serum: Secondary | ICD-10-CM | POA: Diagnosis not present

## 2022-09-22 ENCOUNTER — Encounter: Payer: Self-pay | Admitting: *Deleted

## 2022-12-27 DIAGNOSIS — M1990 Unspecified osteoarthritis, unspecified site: Secondary | ICD-10-CM | POA: Diagnosis not present

## 2022-12-27 DIAGNOSIS — I7300 Raynaud's syndrome without gangrene: Secondary | ICD-10-CM | POA: Diagnosis not present

## 2022-12-27 DIAGNOSIS — Z79899 Other long term (current) drug therapy: Secondary | ICD-10-CM | POA: Diagnosis not present

## 2022-12-27 DIAGNOSIS — R768 Other specified abnormal immunological findings in serum: Secondary | ICD-10-CM | POA: Diagnosis not present

## 2022-12-27 DIAGNOSIS — M0609 Rheumatoid arthritis without rheumatoid factor, multiple sites: Secondary | ICD-10-CM | POA: Diagnosis not present

## 2022-12-27 DIAGNOSIS — L309 Dermatitis, unspecified: Secondary | ICD-10-CM | POA: Diagnosis not present

## 2022-12-27 DIAGNOSIS — R7982 Elevated C-reactive protein (CRP): Secondary | ICD-10-CM | POA: Diagnosis not present

## 2023-01-25 ENCOUNTER — Encounter: Payer: Self-pay | Admitting: Family Medicine

## 2023-01-25 ENCOUNTER — Ambulatory Visit (INDEPENDENT_AMBULATORY_CARE_PROVIDER_SITE_OTHER): Payer: 59 | Admitting: Family Medicine

## 2023-01-25 VITALS — BP 132/68 | HR 68 | Temp 98.3°F | Resp 18 | Ht 62.0 in | Wt 110.2 lb

## 2023-01-25 DIAGNOSIS — J069 Acute upper respiratory infection, unspecified: Secondary | ICD-10-CM

## 2023-01-25 LAB — POCT INFLUENZA A/B
Influenza A, POC: NEGATIVE
Influenza B, POC: NEGATIVE

## 2023-01-25 LAB — POC COVID19 BINAXNOW: SARS Coronavirus 2 Ag: NEGATIVE

## 2023-01-25 MED ORDER — BENZONATATE 200 MG PO CAPS: 200.0000 mg | ORAL_CAPSULE | Freq: Two times a day (BID) | ORAL | 0 refills | Status: DC | PRN

## 2023-01-25 NOTE — Assessment & Plan Note (Addendum)
Discussed home care for viral illness, including rest, pushing fluids, and OTC medications as needed for symptom relief. I will prescribe some Tessalon for cough. Agree with her holding her MTX this week.  Follow-up if needed for worsening or persistent symptoms.

## 2023-01-25 NOTE — Progress Notes (Signed)
[facility] [address] [place] [address] Dept: [phone] Dept Fax: [phone]  Office Visit  Subjective:    Patient ID: Christina [name], female    DOB: 1966/08/21, 57 y.o..   MRN: [id number]  Chief Complaint  Patient presents with   Cough    X3 days, fatigue, nasal congestion, has settled in chest  Low grade fever of 100     History of Present Illness:  Patient is in today complaining of cough, fatigue, nasal congestion, and low-grade fever. She has been using Mucinex to see if this will loosen her cough. Ms. Gibb has a history of RA and is currently on methotrexate. She has been advised to hold her MTX when she is sick. She notes that every time she gets a respiratory infection, it settles in her chest. She has often needed a Z-pack.  Past Medical History: Patient Active Problem List   Diagnosis Date Noted   Rheumatoid arthritis of multiple sites without rheumatoid factor 03/25/2022   Elevated C-reactive protein 06/29/2021   Generalized osteoarthritis of hand 06/29/2021   Osteopenia 06/29/2021   Raynaud's phenomenon 06/29/2021   TMJ pain dysfunction syndrome 06/04/2017   Umbilical pain at incsion, prob muscle strain 03/22/2012   Past Surgical History:  Procedure Laterality Date   BREAST EXCISIONAL BIOPSY Right 2019   sclerosing lesion, adenosis, fibrocystic changes   BREAST LUMPECTOMY WITH RADIOACTIVE SEED LOCALIZATION Right 06/08/2018   Procedure: RIGHT BREAST LUMPECTOMY WITH RADIOACTIVE SEED LOCALIZATION;  Surgeon: Abigail Miyamoto, MD;  Location: [facility];  Service: General;  Laterality: Right;   LAPAROSCOPIC APPENDECTOMY  02/16/2012   Procedure: APPENDECTOMY LAPAROSCOPIC;  Surgeon: Shelly Rubenstein, MD;  Location: [facility];  Service: General;  Laterality: N/A;   TONSILLECTOMY     TUBAL LIGATION     WISDOM TOOTH EXTRACTION     Family History  Problem Relation Age of Onset   Heart disease Mother     Stroke Mother    Heart attack Mother    Kidney failure Mother    Cancer Father        prostate   Parkinson's disease Father    Colon cancer Neg Hx    Esophageal cancer Neg Hx    Rectal cancer Neg Hx    Stomach cancer Neg Hx    Outpatient Medications Prior to Visit  Medication Sig Dispense Refill   acetaminophen (TYLENOL 8 HOUR ARTHRITIS PAIN) 650 MG CR tablet 2 tablets as needed     Cholecalciferol (VITAMIN D-3) 25 MCG (1000 UT) CAPS Take 1 capsule by mouth daily.     Collagen Hydrolysate POWD 1 Scoop by Does not apply route daily.     CRANBERRY PO Take 2 tablets by mouth daily in the afternoon.     folic acid (FOLVITE) 1 MG tablet folic acid 1 mg tablet  TAKE 1 TABLET BY MOUTH ONCE DAILY     hydroxychloroquine (PLAQUENIL) 200 MG tablet Take 150 mg by mouth daily.     Lysine 500 MG TABS Take 500 mg by mouth daily.      methotrexate (RHEUMATREX) 2.5 MG tablet methotrexate sodium 2.5 mg tablet  TAKE 4 TABLETS BY MOUTH ONCE A WEEK AS DIRECTED     Multiple Vitamins-Minerals (MULTIVITAMIN WITH MINERALS) tablet Take 1 tablet by mouth daily.      ondansetron (ZOFRAN ODT) 4 MG disintegrating tablet Take 1 tablet (4 mg total) by mouth every 8 (eight) hours as needed for nausea or vomiting. (Patient not  taking: Reported on 01/25/2023) 30 tablet 1   pantoprazole (PROTONIX) 40 MG tablet Take 1 tablet (40 mg total) by mouth daily. (Patient not taking: Reported on 01/25/2023) 30 tablet 0   valACYclovir (VALTREX) 1000 MG tablet Take 2 tablets (2,000 mg total) by mouth 2 (two) times daily. (Patient not taking: Reported on 01/25/2023) 4 tablet 0   No facility-administered medications prior to visit.   Allergies  Allergen Reactions   Amoxicillin-Pot Clavulanate Other (See Comments)     Objective:   Today's Vitals   01/25/23 1600  BP: 132/68  Pulse: 68  Resp: 18  Temp: 98.3 F (36.8 C)  TempSrc: Oral  SpO2: 98%  Weight: 110 lb 4 oz (50 kg)  Height:  (1.575 m)   Body mass index is 20.16  kg/m.   General: Well developed, well nourished. No acute distress. HEENT: Normocephalic, non-traumatic. PERRL, EOMI. Conjunctiva clear. External ears normal. EAC and TMs normal   bilaterally. Nose clear without congestion or rhinorrhea. Mucous membranes moist. Moderate mucous streaking of   posterior oropharynx. Neck: Supple. There is an enlarged lymph node in the left neck overlying the left carotid bifurcation (stable by history). No   thyromegaly. Lungs: Clear to auscultation bilaterally. No wheezing, rales or rhonchi. Psych: Alert and oriented. Normal mood and affect.  Health Maintenance Due  Topic Date Due   Zoster Vaccines- Shingrix (1 of 2) Never done   PAP SMEAR-Modifier  04/12/2019     Lab Results: POCT Covid: Neg. POCT Influenza A& B: Neg.  Assessment & Plan:   Problem List Items Addressed This Visit       Respiratory   Upper respiratory tract infection - Primary    Discussed home care for viral illness, including rest, pushing fluids, and OTC medications as needed for symptom relief. I will prescribe some Tessalon for cough. Agree with her holding her MTX this week.  Follow-up if needed for worsening or persistent symptoms.       Relevant Medications   benzonatate (TESSALON) 200 MG capsule   Other Relevant Orders   POCT Influenza A/B (Completed)   POC COVID-19 (Completed)    No follow-ups on file.   Loyola Mast, MD

## 2023-01-27 ENCOUNTER — Telehealth: Payer: Self-pay | Admitting: Physician Assistant

## 2023-01-27 DIAGNOSIS — Z79899 Other long term (current) drug therapy: Secondary | ICD-10-CM

## 2023-01-27 DIAGNOSIS — J40 Bronchitis, not specified as acute or chronic: Secondary | ICD-10-CM

## 2023-01-27 MED ORDER — AZITHROMYCIN 250 MG PO TABS: ORAL_TABLET | ORAL | 0 refills | Status: AC

## 2023-01-27 NOTE — Telephone Encounter (Signed)
Pt was seen recently by Dr Veto Kemps and she is not feeling any better. She is wanting an antibiotic before this weekend. Please advise pt at [phone].  [facility] [address] [place] - [address] [place] [address] Phone: [phone]  Fax: [phone]

## 2023-01-27 NOTE — Telephone Encounter (Signed)
Lft detailed VM that RX was sent to the pharmacy. Dm/cma  

## 2023-03-16 ENCOUNTER — Ambulatory Visit: Payer: 59 | Admitting: Nurse Practitioner

## 2023-03-16 ENCOUNTER — Telehealth: Payer: Self-pay | Admitting: Physician Assistant

## 2023-03-16 NOTE — Telephone Encounter (Signed)
I cancelled appt off Dar, it was made today from another office to see Lauren. She was worked into her dermatologists office.

## 2023-03-20 DIAGNOSIS — L218 Other seborrheic dermatitis: Secondary | ICD-10-CM | POA: Diagnosis not present

## 2023-03-20 DIAGNOSIS — L239 Allergic contact dermatitis, unspecified cause: Secondary | ICD-10-CM | POA: Diagnosis not present

## 2023-03-28 DIAGNOSIS — Z79899 Other long term (current) drug therapy: Secondary | ICD-10-CM | POA: Diagnosis not present

## 2023-03-28 DIAGNOSIS — M0609 Rheumatoid arthritis without rheumatoid factor, multiple sites: Secondary | ICD-10-CM | POA: Diagnosis not present

## 2023-03-28 DIAGNOSIS — M25562 Pain in left knee: Secondary | ICD-10-CM | POA: Diagnosis not present

## 2023-03-28 DIAGNOSIS — R7982 Elevated C-reactive protein (CRP): Secondary | ICD-10-CM | POA: Diagnosis not present

## 2023-03-28 DIAGNOSIS — I7300 Raynaud's syndrome without gangrene: Secondary | ICD-10-CM | POA: Diagnosis not present

## 2023-03-28 DIAGNOSIS — R768 Other specified abnormal immunological findings in serum: Secondary | ICD-10-CM | POA: Diagnosis not present

## 2023-03-28 DIAGNOSIS — M7050 Other bursitis of knee, unspecified knee: Secondary | ICD-10-CM | POA: Diagnosis not present

## 2023-03-28 DIAGNOSIS — M1990 Unspecified osteoarthritis, unspecified site: Secondary | ICD-10-CM | POA: Diagnosis not present

## 2023-03-28 DIAGNOSIS — L309 Dermatitis, unspecified: Secondary | ICD-10-CM | POA: Diagnosis not present

## 2023-04-27 ENCOUNTER — Ambulatory Visit (INDEPENDENT_AMBULATORY_CARE_PROVIDER_SITE_OTHER): Payer: 59 | Admitting: Family

## 2023-04-27 VITALS — BP 112/74 | HR 77 | Temp 98.0°F | Ht 62.0 in | Wt 113.4 lb

## 2023-04-27 DIAGNOSIS — H9201 Otalgia, right ear: Secondary | ICD-10-CM | POA: Diagnosis not present

## 2023-04-27 NOTE — Progress Notes (Signed)
Patient ID: Christina [name], female    DOB: Oct 22, 1965, 57 y.o.   MRN: [id number]  Chief Complaint  Patient presents with   Ear Pain    Pt c/o right ear pain for several days. Has tried earache drops, Which did help slightly. Pain is throbbing and there is a full sensation.     HPI:      Ear pain:  Pt c/o right ear pain for several days. She first noticed after her husband set off a nail gun very close to her ear & she was afraid the sound burst her eardrum. She has tried OTC herbal earache drops, Which did help slightly she thinks by numbing the ear a little. At times the pain has been throbbing and there is a full sensation. She denies any dizziness or tinnitus.      Assessment & Plan:  1. Right ear pain reassured pt there is no infection noted in her ear. She does have some injection around the TM, but no effusion, erythema or bulging. The ear canal is not swollen or red, no cerumen impaction. Advised she can use the Tylenol arthritis she has at home (unable to take NSAIDs) to help with pain as needed and continue to use the eardrops if providing some relief.  Subjective:    Outpatient Medications Prior to Visit  Medication Sig Dispense Refill   acetaminophen (TYLENOL 8 HOUR ARTHRITIS PAIN) 650 MG CR tablet 2 tablets as needed     Cholecalciferol (VITAMIN D-3) 25 MCG (1000 UT) CAPS Take 1 capsule by mouth daily.     Collagen Hydrolysate POWD 1 Scoop by Does not apply route daily.     CRANBERRY PO Take 2 tablets by mouth daily in the afternoon.     folic acid (FOLVITE) 1 MG tablet folic acid 1 mg tablet  TAKE 1 TABLET BY MOUTH ONCE DAILY     hydroxychloroquine (PLAQUENIL) 200 MG tablet Take 150 mg by mouth daily.     Lysine 500 MG TABS Take 500 mg by mouth daily.      methotrexate (RHEUMATREX) 2.5 MG tablet methotrexate sodium 2.5 mg tablet  TAKE 4 TABLETS BY MOUTH ONCE A WEEK AS DIRECTED     Multiple Vitamins-Minerals (MULTIVITAMIN WITH MINERALS) tablet Take 1 tablet by mouth  daily.      benzonatate (TESSALON) 200 MG capsule Take 1 capsule (200 mg total) by mouth 2 (two) times daily as needed for cough. 20 capsule 0   No facility-administered medications prior to visit.   Past Medical History:  Diagnosis Date   Generalized headaches    Migraine    Situational anxiety    related to daughters death   Past Surgical History:  Procedure Laterality Date   BREAST EXCISIONAL BIOPSY Right 2019   sclerosing lesion, adenosis, fibrocystic changes   BREAST LUMPECTOMY WITH RADIOACTIVE SEED LOCALIZATION Right 06/08/2018   Procedure: RIGHT BREAST LUMPECTOMY WITH RADIOACTIVE SEED LOCALIZATION;  Surgeon: Abigail Miyamoto, MD;  Location: [facility];  Service: General;  Laterality: Right;   LAPAROSCOPIC APPENDECTOMY  02/16/2012   Procedure: APPENDECTOMY LAPAROSCOPIC;  Surgeon: Shelly Rubenstein, MD;  Location: [facility];  Service: General;  Laterality: N/A;   TONSILLECTOMY     TUBAL LIGATION     WISDOM TOOTH EXTRACTION     Allergies  Allergen Reactions   Amoxicillin-Pot Clavulanate Other (See Comments)    Stomach issues      Objective:    Physical Exam Vitals and nursing note reviewed.  Constitutional:  Appearance: Normal appearance.  HENT:     Right Ear: Ear canal normal. Tenderness present. No drainage or swelling. No middle ear effusion. Tympanic membrane is injected (mild on outer edge). Tympanic membrane is not erythematous or bulging.     Left Ear: Tympanic membrane and ear canal normal.  Cardiovascular:     Rate and Rhythm: Normal rate and regular rhythm.  Pulmonary:     Effort: Pulmonary effort is normal.     Breath sounds: Normal breath sounds.  Musculoskeletal:        General: Normal range of motion.  Skin:    General: Skin is warm and dry.  Neurological:     Mental Status: She is alert.  Psychiatric:        Mood and Affect: Mood normal.        Behavior: Behavior normal.    BP 112/74   Pulse 77   Temp 98 F (36.7 C) (Temporal)   Ht 5\' 2"  (1.575  m)   Wt 113 lb 6.4 oz (51.4 kg)   SpO2 98%   BMI 20.74 kg/m  Wt Readings from Last 3 Encounters:  04/27/23 113 lb 6.4 oz (51.4 kg)  01/25/23 110 lb 4 oz (50 kg)  03/25/22 112 lb 12.8 oz (51.2 kg)       Dulce Sellar, NP

## 2023-05-01 ENCOUNTER — Other Ambulatory Visit: Payer: Self-pay | Admitting: Obstetrics and Gynecology

## 2023-05-01 DIAGNOSIS — Z1231 Encounter for screening mammogram for malignant neoplasm of breast: Secondary | ICD-10-CM

## 2023-06-21 DIAGNOSIS — D2271 Melanocytic nevi of right lower limb, including hip: Secondary | ICD-10-CM | POA: Diagnosis not present

## 2023-06-21 DIAGNOSIS — L814 Other melanin hyperpigmentation: Secondary | ICD-10-CM | POA: Diagnosis not present

## 2023-06-21 DIAGNOSIS — D2262 Melanocytic nevi of left upper limb, including shoulder: Secondary | ICD-10-CM | POA: Diagnosis not present

## 2023-06-21 DIAGNOSIS — L821 Other seborrheic keratosis: Secondary | ICD-10-CM | POA: Diagnosis not present

## 2023-06-21 DIAGNOSIS — L819 Disorder of pigmentation, unspecified: Secondary | ICD-10-CM | POA: Diagnosis not present

## 2023-06-21 DIAGNOSIS — D0472 Carcinoma in situ of skin of left lower limb, including hip: Secondary | ICD-10-CM | POA: Diagnosis not present

## 2023-06-21 DIAGNOSIS — D225 Melanocytic nevi of trunk: Secondary | ICD-10-CM | POA: Diagnosis not present

## 2023-06-21 DIAGNOSIS — D2272 Melanocytic nevi of left lower limb, including hip: Secondary | ICD-10-CM | POA: Diagnosis not present

## 2023-06-21 DIAGNOSIS — L570 Actinic keratosis: Secondary | ICD-10-CM | POA: Diagnosis not present

## 2023-06-21 DIAGNOSIS — D2261 Melanocytic nevi of right upper limb, including shoulder: Secondary | ICD-10-CM | POA: Diagnosis not present

## 2023-07-05 DIAGNOSIS — R7982 Elevated C-reactive protein (CRP): Secondary | ICD-10-CM | POA: Diagnosis not present

## 2023-07-05 DIAGNOSIS — M1990 Unspecified osteoarthritis, unspecified site: Secondary | ICD-10-CM | POA: Diagnosis not present

## 2023-07-05 DIAGNOSIS — L309 Dermatitis, unspecified: Secondary | ICD-10-CM | POA: Diagnosis not present

## 2023-07-05 DIAGNOSIS — M0609 Rheumatoid arthritis without rheumatoid factor, multiple sites: Secondary | ICD-10-CM | POA: Diagnosis not present

## 2023-07-05 DIAGNOSIS — R768 Other specified abnormal immunological findings in serum: Secondary | ICD-10-CM | POA: Diagnosis not present

## 2023-07-05 DIAGNOSIS — Z79899 Other long term (current) drug therapy: Secondary | ICD-10-CM | POA: Diagnosis not present

## 2023-07-05 DIAGNOSIS — I7300 Raynaud's syndrome without gangrene: Secondary | ICD-10-CM | POA: Diagnosis not present

## 2023-07-12 ENCOUNTER — Ambulatory Visit
Admission: RE | Admit: 2023-07-12 | Discharge: 2023-07-12 | Disposition: A | Discharge status: Home / Self Care | Payer: 59 | Source: Ambulatory Visit | Attending: Obstetrics and Gynecology | Admitting: Obstetrics and Gynecology

## 2023-07-12 DIAGNOSIS — Z1231 Encounter for screening mammogram for malignant neoplasm of breast: Secondary | ICD-10-CM | POA: Diagnosis not present

## 2023-07-14 DIAGNOSIS — D0472 Carcinoma in situ of skin of left lower limb, including hip: Secondary | ICD-10-CM | POA: Diagnosis not present

## 2023-07-24 ENCOUNTER — Encounter: Payer: Self-pay | Admitting: Physician Assistant

## 2023-07-24 ENCOUNTER — Ambulatory Visit (INDEPENDENT_AMBULATORY_CARE_PROVIDER_SITE_OTHER): Payer: 59 | Admitting: Physician Assistant

## 2023-07-24 VITALS — BP 110/78 | HR 71 | Temp 97.8°F | Ht 62.0 in | Wt 113.2 lb

## 2023-07-24 DIAGNOSIS — Z79899 Other long term (current) drug therapy: Secondary | ICD-10-CM

## 2023-07-24 DIAGNOSIS — R21 Rash and other nonspecific skin eruption: Secondary | ICD-10-CM

## 2023-07-24 MED ORDER — POLYMYXIN B-TRIMETHOPRIM 10000-0.1 UNIT/ML-% OP SOLN: 1.0000 [drp] | OPHTHALMIC | 0 refills | Status: DC

## 2023-07-24 NOTE — Progress Notes (Signed)
Christina [name] is a 57 y.o. female here for a new problem. History of Present Illness:   Chief Complaint  Patient presents with   Rash    Pt c/o rash under bilateral eye and around mouth that started yesterday.   HPI  Rash (Face): Complains of rash bilaterally under her eyes (affecting left eye more) and around her mouth appearing yesterday.  Reports she was working in the yard Sunday, with makeup and sunscreen on, in the shade with her back towards the sun. Noticed a minor red spot that night and applied her regular facial topicals, and then woke up this morning with swelling and minor itching.  Endorses minor fatigue yesterday.   States she applied OTC (available over the counter without a prescription) redness eyedrops in her left eye due to irritation, but hasn't applied anything else to her face since appearance of rash. Endorses temporary improvement of left eye swelling.  Notes she doesn't have regular allergy medication or regimen.   Report her only medication change being temporary hiatus of her 2.5 mg Methotrexate to allow for improved healing after a procedure, but resumed same dose and has been on this medication for a couple of years. She is also on plaquenil.  Endorses regular eye exams. Denies fever/chills, diffusion of rash, discharge/crust from eye, pain when moving eye, vision changes, sick contacts or recent travel.   Past Medical History:  Diagnosis Date   Generalized headaches    Migraine    Situational anxiety    related to daughters death    Social History   Tobacco Use   Smoking status: Never   Smokeless tobacco: Never  Vaping Use   Vaping status: Never Used  Substance Use Topics   Alcohol use: No    Alcohol/week: 0.0 standard drinks of alcohol   Drug use: No   Past Surgical History:  Procedure Laterality Date   BREAST EXCISIONAL BIOPSY Right 2019   sclerosing lesion, adenosis, fibrocystic changes   BREAST LUMPECTOMY WITH RADIOACTIVE SEED  LOCALIZATION Right 06/08/2018   Procedure: RIGHT BREAST LUMPECTOMY WITH RADIOACTIVE SEED LOCALIZATION;  Surgeon: Abigail Miyamoto, MD;  Location: [facility];  Service: General;  Laterality: Right;   LAPAROSCOPIC APPENDECTOMY  02/16/2012   Procedure: APPENDECTOMY LAPAROSCOPIC;  Surgeon: Shelly Rubenstein, MD;  Location: [facility];  Service: General;  Laterality: N/A;   TONSILLECTOMY     TUBAL LIGATION     WISDOM TOOTH EXTRACTION     Family History  Problem Relation Age of Onset   Heart disease Mother    Stroke Mother    Heart attack Mother    Kidney failure Mother    Cancer Father        prostate   Parkinson's disease Father    Colon cancer Neg Hx    Esophageal cancer Neg Hx    Rectal cancer Neg Hx    Stomach cancer Neg Hx    Allergies  Allergen Reactions   Amoxicillin-Pot Clavulanate Other (See Comments)    Stomach issues   Current Medications:   Current Outpatient Medications:    acetaminophen (TYLENOL 8 HOUR ARTHRITIS PAIN) 650 MG CR tablet, 2 tablets as needed, Disp: , Rfl:    Cholecalciferol (VITAMIN D-3) 25 MCG (1000 UT) CAPS, Take 1 capsule by mouth daily., Disp: , Rfl:    Collagen Hydrolysate POWD, 1 Scoop by Does not apply route daily., Disp: , Rfl:    CRANBERRY PO, Take 2 tablets by mouth daily in the afternoon., Disp: , Rfl:  folic acid (FOLVITE) 1 MG tablet, folic acid 1 mg tablet  TAKE 1 TABLET BY MOUTH ONCE DAILY, Disp: , Rfl:    hydroxychloroquine (PLAQUENIL) 200 MG tablet, Take 150 mg by mouth daily., Disp: , Rfl:    Lysine 500 MG TABS, Take 500 mg by mouth daily. , Disp: , Rfl:    methotrexate (RHEUMATREX) 2.5 MG tablet, methotrexate sodium 2.5 mg tablet  TAKE 4 TABLETS BY MOUTH ONCE A WEEK AS DIRECTED, Disp: , Rfl:    Multiple Vitamins-Minerals (MULTIVITAMIN WITH MINERALS) tablet, Take 1 tablet by mouth daily. , Disp: , Rfl:    trimethoprim-polymyxin b (POLYTRIM) ophthalmic solution, Place 1 drop into the left eye every 4 (four) hours., Disp: 10 mL, Rfl: 0  Review of  Systems:   ROS See pertinent positives and negatives as per the HPI.  Vitals:   Vitals:   07/24/23 1021  BP: 110/78  Pulse: 71  Temp: 97.8 F (36.6 C)  SpO2: 98%  Weight: 113 lb 3.2 oz (51.3 kg)  Height: 5\' 2"  (1.575 m)     Body mass index is 20.7 kg/m.  Physical Exam:   Physical Exam Vitals and nursing note reviewed.  Constitutional:      General: She is not in acute distress.    Appearance: She is well-developed. She is not ill-appearing or toxic-appearing.  HENT:     Head: Normocephalic and atraumatic.     Right Ear: Tympanic membrane, ear canal and external ear normal. Tympanic membrane is not erythematous, retracted or bulging.     Left Ear: Tympanic membrane, ear canal and external ear normal. Tympanic membrane is not erythematous, retracted or bulging.     Nose: Nose normal.     Right Sinus: No maxillary sinus tenderness or frontal sinus tenderness.     Left Sinus: No maxillary sinus tenderness or frontal sinus tenderness.     Mouth/Throat:     Pharynx: Uvula midline. No posterior oropharyngeal erythema.  Eyes:     General: Lids are normal.     Extraocular Movements: Extraocular movements intact.     Right eye: Normal extraocular motion.     Left eye: Normal extraocular motion.     Conjunctiva/sclera: Conjunctivae normal.     Comments: Bilateral lower eye lids with erythema and slight swelling left>right   Slight injected conjunctiva of left eye; no discharge  Neck:     Trachea: Trachea normal.  Cardiovascular:     Rate and Rhythm: Normal rate and regular rhythm.     Pulses: Normal pulses.     Heart sounds: Normal heart sounds, S1 normal and S2 normal.  Pulmonary:     Effort: Pulmonary effort is normal.     Breath sounds: Normal breath sounds. No decreased breath sounds, wheezing, rhonchi or rales.  Lymphadenopathy:     Cervical: No cervical adenopathy.  Skin:    General: Skin is warm and dry.     Comments: Mild erythematous patches to b/l upper corners  of mouth  Neurological:     Mental Status: She is alert.     GCS: GCS eye subscore is 4. GCS verbal subscore is 5. GCS motor subscore is 6.  Psychiatric:        Speech: Speech normal.        Behavior: Behavior normal. Behavior is cooperative.     Assessment and Plan:   Rash and nonspecific skin eruption; High risk medication use Unclear etiology No red flags Possible increased sun exposure over the weekend with ongoing  chronic use of photosensitive medications vs dermatitis vs early infection -- less likely to suspect infection at this time No evidence of blistering or bullae -- do not suspect SJS or TEN at this time however if any new/worsening symptom(s), needs to notify us ASAP and go to the ER  Recommendations as follows: -Restart generic zyrtec -Use medicated eye drops in your left eye -Trial topical 1% hydrocortisone (look for OTC (available over the counter without a prescription) "anti-itch" creams) and apply to area 1-2 times daily -Continue spf -If new/worsening symptom(s), let me know as soon as possible -- we may need to add oral steroids and/or antibiotic(s) based on symptoms  Will also fax this note to her rheumatologist for further evaluation per her request  I,Emily Lagle,acting as a scribe for Jarold Motto, PA.,have documented all relevant documentation on the behalf of Jarold Motto, PA,as directed by  Jarold Motto, PA while in the presence of Jarold Motto, [place].  I, Jarold Motto, [place], have reviewed all documentation for this visit. The documentation on 07/24/23 for the exam, diagnosis, procedures, and orders are all accurate and complete.  Jarold Motto, PA-C

## 2023-07-24 NOTE — Patient Instructions (Signed)
It was great to see you!  Restart generic zyrtec  Use medicated eye drops in your left eye  Trial topical 1% hydrocortisone (look for OTC (available over the counter without a prescription) "anti-itch" creams) and apply to area 1-2 times daily  Continue spf  If new/worsening symptom(s), let me know as soon as possible -- we may need to add oral steroids and/or antibiotic(s) based on symptoms  Take care,  Jarold Motto PA-C

## 2023-07-26 ENCOUNTER — Telehealth: Payer: Self-pay | Admitting: Physician Assistant

## 2023-07-26 MED ORDER — DOXYCYCLINE HYCLATE 100 MG PO TABS: 100.0000 mg | ORAL_TABLET | Freq: Two times a day (BID) | ORAL | 0 refills | Status: DC

## 2023-07-26 NOTE — Telephone Encounter (Signed)
Please see message and advise 

## 2023-07-26 NOTE — Telephone Encounter (Signed)
Spoke to pt told her sending in Doxycycline 100 mg twice a day x 7 days to the pharmacy. Pt verbalized understanding and asked if she should stop her Methotrexate? Told her she should check with her Rheumatologist. Pt verbalized understanding. Rx sent to pharmacy.

## 2023-07-26 NOTE — Telephone Encounter (Signed)
Patient states redness on face is getting better. Has to use eye drops in both eyes. Has congestion. No fever. Requests to be advised if she needs an antibiotic.

## 2023-08-10 DIAGNOSIS — H16143 Punctate keratitis, bilateral: Secondary | ICD-10-CM | POA: Diagnosis not present

## 2023-08-10 DIAGNOSIS — H04123 Dry eye syndrome of bilateral lacrimal glands: Secondary | ICD-10-CM | POA: Diagnosis not present

## 2023-08-11 DIAGNOSIS — Z13820 Encounter for screening for osteoporosis: Secondary | ICD-10-CM | POA: Diagnosis not present

## 2023-08-23 DIAGNOSIS — Z1151 Encounter for screening for human papillomavirus (HPV): Secondary | ICD-10-CM | POA: Diagnosis not present

## 2023-08-23 DIAGNOSIS — Z01419 Encounter for gynecological examination (general) (routine) without abnormal findings: Secondary | ICD-10-CM | POA: Diagnosis not present

## 2023-08-23 DIAGNOSIS — Z6821 Body mass index (BMI) 21.0-21.9, adult: Secondary | ICD-10-CM | POA: Diagnosis not present

## 2023-08-23 DIAGNOSIS — Z124 Encounter for screening for malignant neoplasm of cervix: Secondary | ICD-10-CM | POA: Diagnosis not present

## 2023-08-24 LAB — HM PAP SMEAR
HM Pap smear: NEGATIVE
HPV, high-risk: NEGATIVE

## 2023-09-27 DIAGNOSIS — R7982 Elevated C-reactive protein (CRP): Secondary | ICD-10-CM | POA: Diagnosis not present

## 2023-09-27 DIAGNOSIS — Z79899 Other long term (current) drug therapy: Secondary | ICD-10-CM | POA: Diagnosis not present

## 2023-09-27 DIAGNOSIS — R768 Other specified abnormal immunological findings in serum: Secondary | ICD-10-CM | POA: Diagnosis not present

## 2023-09-27 DIAGNOSIS — I7300 Raynaud's syndrome without gangrene: Secondary | ICD-10-CM | POA: Diagnosis not present

## 2023-09-27 DIAGNOSIS — M25562 Pain in left knee: Secondary | ICD-10-CM | POA: Diagnosis not present

## 2023-09-27 DIAGNOSIS — M0609 Rheumatoid arthritis without rheumatoid factor, multiple sites: Secondary | ICD-10-CM | POA: Diagnosis not present

## 2023-09-27 DIAGNOSIS — M1990 Unspecified osteoarthritis, unspecified site: Secondary | ICD-10-CM | POA: Diagnosis not present

## 2023-09-28 DIAGNOSIS — M25562 Pain in left knee: Secondary | ICD-10-CM | POA: Diagnosis not present

## 2023-09-28 DIAGNOSIS — M25561 Pain in right knee: Secondary | ICD-10-CM | POA: Diagnosis not present

## 2023-10-27 ENCOUNTER — Telehealth (INDEPENDENT_AMBULATORY_CARE_PROVIDER_SITE_OTHER): Payer: 59 | Admitting: Physician Assistant

## 2023-10-27 ENCOUNTER — Encounter: Payer: Self-pay | Admitting: Physician Assistant

## 2023-10-27 DIAGNOSIS — R6889 Other general symptoms and signs: Secondary | ICD-10-CM

## 2023-10-27 MED ORDER — OSELTAMIVIR PHOSPHATE 75 MG PO CAPS: 75.0000 mg | ORAL_CAPSULE | Freq: Two times a day (BID) | ORAL | 0 refills | Status: DC

## 2023-10-27 NOTE — Progress Notes (Signed)
I acted as a Neurosurgeon for [employer], PA-C Corky Mull, LPN  Virtual Visit via Video Note   I, Jarold Motto, PA, connected with  Christina [name]  ([id number], 1966-07-03) on 10/27/23 at  8:40 AM EST by a video-enabled telemedicine application and verified that I am speaking with the correct person using two identifiers.  Location: Patient: Home Provider: [facility] [place] office   I discussed the limitations of evaluation and management by telemedicine and the availability of in person appointments. The patient expressed understanding and agreed to proceed.    History of Present Illness: Christina [name] is a 58 y.o. who identifies as a female who was assigned female at birth, and is being seen today for fever.   Pt c/o fever, headache(s), nasal congestion and body aches started on Wednesday. Fever 101. Taking OTC (available over the counter without a prescription) sinus nighttime tabs, OTC (available over the counter without a prescription) cold/flu liquid medication. She is pushing fluids. Has nausea and poor appetite. Denies sick contacts. No prior flu vaxx this year. Does not have COVID test at home. Does not think its COVID.  Has productive cough.  She is on methotrexate and plaquenil.   Problems:  Patient Active Problem List   Diagnosis Date Noted   Upper respiratory tract infection 01/25/2023   Rheumatoid arthritis of multiple sites without rheumatoid factor (HCC) 03/25/2022   Elevated C-reactive protein 06/29/2021   Generalized osteoarthritis of hand 06/29/2021   Osteopenia 06/29/2021   Raynaud's phenomenon 06/29/2021   TMJ pain dysfunction syndrome 06/04/2017   Umbilical pain at incsion, prob muscle strain 03/22/2012    Allergies:  Allergies  Allergen Reactions   Amoxicillin-Pot Clavulanate Other (See Comments)    Stomach issues   Medications:  Current Outpatient Medications:    acetaminophen (TYLENOL 8 HOUR ARTHRITIS PAIN) 650 MG CR tablet,  2 tablets as needed, Disp: , Rfl:    Cholecalciferol (VITAMIN D-3) 25 MCG (1000 UT) CAPS, Take 1 capsule by mouth daily., Disp: , Rfl:    Collagen Hydrolysate POWD, 1 Scoop by Does not apply route daily., Disp: , Rfl:    CRANBERRY PO, Take 2 tablets by mouth daily in the afternoon., Disp: , Rfl:    folic acid (FOLVITE) 1 MG tablet, folic acid 1 mg tablet  TAKE 1 TABLET BY MOUTH ONCE DAILY, Disp: , Rfl:    hydroxychloroquine (PLAQUENIL) 200 MG tablet, Take 150 mg by mouth daily., Disp: , Rfl:    Lysine 500 MG TABS, Take 500 mg by mouth daily. , Disp: , Rfl:    methotrexate (RHEUMATREX) 2.5 MG tablet, methotrexate sodium 2.5 mg tablet  TAKE 4 TABLETS BY MOUTH ONCE A WEEK AS DIRECTED, Disp: , Rfl:    Multiple Vitamins-Minerals (MULTIVITAMIN WITH MINERALS) tablet, Take 1 tablet by mouth daily. , Disp: , Rfl:    oseltamivir (TAMIFLU) 75 MG capsule, Take 1 capsule (75 mg total) by mouth 2 (two) times daily for 5 days., Disp: 10 capsule, Rfl: 0  Observations/Objective: Patient is well-developed, well-nourished in no acute distress.  Resting comfortably at home.  Head is normocephalic, atraumatic.  No labored breathing.  Speech is clear and coherent with logical content.  Patient is alert and oriented at baseline.    Assessment and Plan: 1. Flu-like symptoms (Primary) No red flags on exam.   Will initiate tamiflu for suspected flu per orders. Review risks and benefits/side effect(s)  Discussed taking medications as prescribed.  Reviewed return precautions including new or worsening  fever, SOB, new or worsening cough or other concerns.  Push fluids and rest.  I recommend that patient follow-up if symptoms worsen or persist despite treatment x 7-10 days, sooner if needed.  Follow Up Instructions: I discussed the assessment and treatment plan with the patient. The patient was provided an opportunity to ask questions and all were answered. The patient agreed with the plan and demonstrated an  understanding of the instructions.  A copy of instructions were sent to the patient via MyChart unless otherwise noted below.   The patient was advised to call back or seek an in-person evaluation if the symptoms worsen or if the condition fails to improve as anticipated.  Jarold Motto, [place]

## 2023-11-01 ENCOUNTER — Ambulatory Visit: Payer: 59 | Admitting: Physician Assistant

## 2023-11-01 ENCOUNTER — Encounter: Payer: Self-pay | Admitting: Physician Assistant

## 2023-11-01 VITALS — BP 118/66 | HR 78 | Temp 97.3°F | Ht 62.0 in | Wt 112.4 lb

## 2023-11-01 DIAGNOSIS — R051 Acute cough: Secondary | ICD-10-CM

## 2023-11-01 MED ORDER — AZITHROMYCIN 250 MG PO TABS: ORAL_TABLET | ORAL | 0 refills | Status: AC

## 2023-11-01 MED ORDER — BENZONATATE 100 MG PO CAPS: 100.0000 mg | ORAL_CAPSULE | Freq: Three times a day (TID) | ORAL | 1 refills | Status: DC | PRN

## 2023-11-01 NOTE — Progress Notes (Signed)
Christina [name] is a 58 y.o. female here for a new problem.  History of Present Illness:   Chief Complaint  Patient presents with  . Cough    Pt c/o coughing and chest congestion, some expectoration. Finished Tamflu     Cough  She complains today of a cough that has persisted for the past  She is experiencing accompanying chest congestion and expectoration. She finished Tamflu yesterday and tolerated it well. She was also taking liquid cold and flu. She states that taking Mucinex further irritates her chest/esophagus.  She states that her cough has been worsening which further irritates her chest.  She also express's concern of developing pneumonia.  Her temperature alternates between 98-99 degrees.   Past Medical History:  Diagnosis Date  . Generalized headaches    Migraine   . Situational anxiety    related to daughters death     Social History   Tobacco Use  . Smoking status: Never  . Smokeless tobacco: Never  Vaping Use  . Vaping status: Never Used  Substance Use Topics  . Alcohol use: No    Alcohol/week: 0.0 standard drinks of alcohol  . Drug use: No    Past Surgical History:  Procedure Laterality Date  . BREAST EXCISIONAL BIOPSY Right 2019   sclerosing lesion, adenosis, fibrocystic changes  . BREAST LUMPECTOMY WITH RADIOACTIVE SEED LOCALIZATION Right 06/08/2018   Procedure: RIGHT BREAST LUMPECTOMY WITH RADIOACTIVE SEED LOCALIZATION;  Surgeon: Abigail Miyamoto, MD;  Location: [facility];  Service: General;  Laterality: Right;  . LAPAROSCOPIC APPENDECTOMY  02/16/2012   Procedure: APPENDECTOMY LAPAROSCOPIC;  Surgeon: Shelly Rubenstein, MD;  Location: [facility];  Service: General;  Laterality: N/A;  . TONSILLECTOMY    . TUBAL LIGATION    . WISDOM TOOTH EXTRACTION      Family History  Problem Relation Age of Onset  . Heart disease Mother   . Stroke Mother   . Heart attack Mother   . Kidney failure Mother   . Cancer Father        prostate  . Parkinson's  disease Father   . Colon cancer Neg Hx   . Esophageal cancer Neg Hx   . Rectal cancer Neg Hx   . Stomach cancer Neg Hx     Allergies  Allergen Reactions  . Amoxicillin-Pot Clavulanate Other (See Comments)    Stomach issues    Current Medications:   Current Outpatient Medications:  .  acetaminophen (TYLENOL 8 HOUR ARTHRITIS PAIN) 650 MG CR tablet, 2 tablets as needed, Disp: , Rfl:  .  Cholecalciferol (VITAMIN D-3) 25 MCG (1000 UT) CAPS, Take 1 capsule by mouth daily., Disp: , Rfl:  .  Collagen Hydrolysate POWD, 1 Scoop by Does not apply route daily., Disp: , Rfl:  .  CRANBERRY PO, Take 2 tablets by mouth daily in the afternoon., Disp: , Rfl:  .  folic acid (FOLVITE) 1 MG tablet, folic acid 1 mg tablet  TAKE 1 TABLET BY MOUTH ONCE DAILY, Disp: , Rfl:  .  hydroxychloroquine (PLAQUENIL) 200 MG tablet, Take 150 mg by mouth daily., Disp: , Rfl:  .  Lysine 500 MG TABS, Take 500 mg by mouth daily. , Disp: , Rfl:  .  methotrexate (RHEUMATREX) 2.5 MG tablet, methotrexate sodium 2.5 mg tablet  TAKE 4 TABLETS BY MOUTH ONCE A WEEK AS DIRECTED, Disp: , Rfl:  .  Multiple Vitamins-Minerals (MULTIVITAMIN WITH MINERALS) tablet, Take 1 tablet by mouth daily. , Disp: , Rfl:    Review  of Systems:   Review of Systems  Constitutional:  Negative for fever.  HENT:  Positive for congestion.   Respiratory:  Positive for cough and shortness of breath.   Cardiovascular:  Positive for chest pain.    Vitals:   Vitals:   11/01/23 0832  BP: 118/66  Pulse: 78  Temp: (!) 97.3 F (36.3 C)  TempSrc: Temporal  SpO2: 97%  Weight: 112 lb 6.1 oz (51 kg)  Height: 5\' 2"  (1.575 m)     Body mass index is 20.55 kg/m.  Physical Exam:   Physical Exam Vitals and nursing note reviewed.  Constitutional:      General: She is not in acute distress.    Appearance: She is well-developed. She is not ill-appearing or toxic-appearing.  HENT:     Head: Normocephalic and atraumatic.     Right Ear: Tympanic membrane,  ear canal and external ear normal. Tympanic membrane is not erythematous, retracted or bulging.     Left Ear: Tympanic membrane, ear canal and external ear normal. Tympanic membrane is not erythematous, retracted or bulging.     Nose: Nose normal.     Right Sinus: No maxillary sinus tenderness or frontal sinus tenderness.     Left Sinus: No maxillary sinus tenderness or frontal sinus tenderness.     Mouth/Throat:     Pharynx: Uvula midline. Posterior oropharyngeal erythema present.  Eyes:     General: Lids are normal.     Conjunctiva/sclera: Conjunctivae normal.  Neck:     Trachea: Trachea normal.  Cardiovascular:     Rate and Rhythm: Normal rate and regular rhythm.     Heart sounds: Normal heart sounds, S1 normal and S2 normal.  Pulmonary:     Effort: Pulmonary effort is normal.     Breath sounds: Normal breath sounds. No decreased breath sounds, wheezing, rhonchi or rales.  Lymphadenopathy:     Cervical: No cervical adenopathy.  Skin:    General: Skin is warm and dry.  Neurological:     Mental Status: She is alert.  Psychiatric:        Speech: Speech normal.        Behavior: Behavior normal. Behavior is cooperative.    Assessment and Plan:   Acute cough No red flags on exam.   Will initiate azithromycin and tessalon perles per orders.  Discussed taking medications as prescribed.  Reviewed return precautions including new or worsening fever, SOB, new or worsening cough or other concerns.  Push fluids and rest.  I recommend that patient follow-up if symptoms worsen or persist despite treatment x 7-10 days, sooner if needed.  Jarold Motto, PA-C  I,Safa M Kadhim,acting as a scribe for Jarold Motto, PA.,have documented all relevant documentation on the behalf of Jarold Motto, PA,as directed by  Jarold Motto, PA while in the presence of Jarold Motto, [place].   I, Jarold Motto, [place], have reviewed all documentation for this visit. The documentation on 11/01/23 for  the exam, diagnosis, procedures, and orders are all accurate and complete.

## 2023-11-01 NOTE — Patient Instructions (Signed)
It was great to see you!  Look for ingredient: Guaifenesin  Start azithromycin (z-pack)  If new/worsening symptom(s), please follow-up with [place].   Take care,  Jarold Motto PA-C

## 2023-12-27 DIAGNOSIS — I7300 Raynaud's syndrome without gangrene: Secondary | ICD-10-CM | POA: Diagnosis not present

## 2023-12-27 DIAGNOSIS — M0609 Rheumatoid arthritis without rheumatoid factor, multiple sites: Secondary | ICD-10-CM | POA: Diagnosis not present

## 2023-12-27 DIAGNOSIS — M1990 Unspecified osteoarthritis, unspecified site: Secondary | ICD-10-CM | POA: Diagnosis not present

## 2023-12-27 DIAGNOSIS — Z79899 Other long term (current) drug therapy: Secondary | ICD-10-CM | POA: Diagnosis not present

## 2023-12-27 DIAGNOSIS — M25562 Pain in left knee: Secondary | ICD-10-CM | POA: Diagnosis not present

## 2023-12-27 DIAGNOSIS — R768 Other specified abnormal immunological findings in serum: Secondary | ICD-10-CM | POA: Diagnosis not present

## 2024-02-28 DIAGNOSIS — M6281 Muscle weakness (generalized): Secondary | ICD-10-CM | POA: Diagnosis not present

## 2024-02-28 DIAGNOSIS — M25562 Pain in left knee: Secondary | ICD-10-CM | POA: Diagnosis not present

## 2024-03-06 DIAGNOSIS — M25562 Pain in left knee: Secondary | ICD-10-CM | POA: Diagnosis not present

## 2024-03-06 DIAGNOSIS — M6281 Muscle weakness (generalized): Secondary | ICD-10-CM | POA: Diagnosis not present

## 2024-03-13 DIAGNOSIS — M25562 Pain in left knee: Secondary | ICD-10-CM | POA: Diagnosis not present

## 2024-03-13 DIAGNOSIS — M6281 Muscle weakness (generalized): Secondary | ICD-10-CM | POA: Diagnosis not present

## 2024-04-03 DIAGNOSIS — M1990 Unspecified osteoarthritis, unspecified site: Secondary | ICD-10-CM | POA: Diagnosis not present

## 2024-04-03 DIAGNOSIS — M25562 Pain in left knee: Secondary | ICD-10-CM | POA: Diagnosis not present

## 2024-04-03 DIAGNOSIS — I7300 Raynaud's syndrome without gangrene: Secondary | ICD-10-CM | POA: Diagnosis not present

## 2024-04-03 DIAGNOSIS — R768 Other specified abnormal immunological findings in serum: Secondary | ICD-10-CM | POA: Diagnosis not present

## 2024-04-03 DIAGNOSIS — M0609 Rheumatoid arthritis without rheumatoid factor, multiple sites: Secondary | ICD-10-CM | POA: Diagnosis not present

## 2024-04-03 DIAGNOSIS — Z79899 Other long term (current) drug therapy: Secondary | ICD-10-CM | POA: Diagnosis not present

## 2024-05-28 ENCOUNTER — Other Ambulatory Visit: Payer: Self-pay | Admitting: Obstetrics and Gynecology

## 2024-05-28 DIAGNOSIS — Z1231 Encounter for screening mammogram for malignant neoplasm of breast: Secondary | ICD-10-CM

## 2024-07-17 DIAGNOSIS — M0609 Rheumatoid arthritis without rheumatoid factor, multiple sites: Secondary | ICD-10-CM | POA: Diagnosis not present

## 2024-07-17 DIAGNOSIS — M25562 Pain in left knee: Secondary | ICD-10-CM | POA: Diagnosis not present

## 2024-07-17 DIAGNOSIS — M1990 Unspecified osteoarthritis, unspecified site: Secondary | ICD-10-CM | POA: Diagnosis not present

## 2024-07-17 DIAGNOSIS — M25511 Pain in right shoulder: Secondary | ICD-10-CM | POA: Diagnosis not present

## 2024-07-17 DIAGNOSIS — M898X1 Other specified disorders of bone, shoulder: Secondary | ICD-10-CM | POA: Diagnosis not present

## 2024-07-17 DIAGNOSIS — R7689 Other specified abnormal immunological findings in serum: Secondary | ICD-10-CM | POA: Diagnosis not present

## 2024-07-17 DIAGNOSIS — M542 Cervicalgia: Secondary | ICD-10-CM | POA: Diagnosis not present

## 2024-07-17 DIAGNOSIS — I7300 Raynaud's syndrome without gangrene: Secondary | ICD-10-CM | POA: Diagnosis not present

## 2024-07-17 DIAGNOSIS — Z79899 Other long term (current) drug therapy: Secondary | ICD-10-CM | POA: Diagnosis not present

## 2024-07-17 LAB — HEPATIC FUNCTION PANEL
ALT: 13 U/L (ref 7–35)
AST: 20 (ref 13–35)
Alkaline Phosphatase: 73 (ref 25–125)
Bilirubin, Total: 0.3

## 2024-07-17 LAB — CBC AND DIFFERENTIAL
HCT: 43 (ref 36–46)
Hemoglobin: 14 (ref 12.0–16.0)
Neutrophils Absolute: 4.4
Platelets: 274 K/uL (ref 150–400)
WBC: 6.9

## 2024-07-17 LAB — COMPREHENSIVE METABOLIC PANEL WITH GFR
Albumin: 4.6 (ref 3.5–5.0)
Calcium: 9.5 (ref 8.7–10.7)
Globulin: 1.8
eGFR: 103

## 2024-07-17 LAB — BASIC METABOLIC PANEL WITH GFR
BUN: 17 (ref 4–21)
CO2: 22 (ref 13–22)
Chloride: 104 (ref 99–108)
Creatinine: 0.6 (ref 0.5–1.1)
Glucose: 84
Potassium: 5.3 meq/L — AB (ref 3.5–5.1)
Sodium: 142 (ref 137–147)

## 2024-07-17 LAB — POCT ERYTHROCYTE SEDIMENTATION RATE, NON-AUTOMATED: Sed Rate: 2

## 2024-07-17 LAB — CBC: RBC: 4.41 (ref 3.87–5.11)

## 2024-07-18 ENCOUNTER — Encounter: Payer: Self-pay | Admitting: Physician Assistant

## 2024-07-24 ENCOUNTER — Ambulatory Visit
Admission: RE | Admit: 2024-07-24 | Discharge: 2024-07-24 | Disposition: A | Discharge status: Home / Self Care | Source: Ambulatory Visit | Attending: Obstetrics and Gynecology | Admitting: Obstetrics and Gynecology

## 2024-07-24 DIAGNOSIS — Z1231 Encounter for screening mammogram for malignant neoplasm of breast: Secondary | ICD-10-CM

## 2024-09-11 ENCOUNTER — Other Ambulatory Visit: Payer: Self-pay | Admitting: Obstetrics and Gynecology

## 2024-09-11 DIAGNOSIS — Z6821 Body mass index (BMI) 21.0-21.9, adult: Secondary | ICD-10-CM | POA: Diagnosis not present

## 2024-09-11 DIAGNOSIS — Z8249 Family history of ischemic heart disease and other diseases of the circulatory system: Secondary | ICD-10-CM

## 2024-09-11 DIAGNOSIS — Z1151 Encounter for screening for human papillomavirus (HPV): Secondary | ICD-10-CM | POA: Diagnosis not present

## 2024-09-11 DIAGNOSIS — Z01419 Encounter for gynecological examination (general) (routine) without abnormal findings: Secondary | ICD-10-CM | POA: Diagnosis not present

## 2024-09-11 DIAGNOSIS — Z124 Encounter for screening for malignant neoplasm of cervix: Secondary | ICD-10-CM | POA: Diagnosis not present

## 2024-09-13 ENCOUNTER — Inpatient Hospital Stay
Admission: RE | Admit: 2024-09-13 | Discharge: 2024-09-13 | Attending: Obstetrics and Gynecology | Admitting: Obstetrics and Gynecology

## 2024-09-13 DIAGNOSIS — Z8249 Family history of ischemic heart disease and other diseases of the circulatory system: Secondary | ICD-10-CM

## 2024-09-25 DIAGNOSIS — Z13220 Encounter for screening for lipoid disorders: Secondary | ICD-10-CM | POA: Diagnosis not present

## 2024-09-25 DIAGNOSIS — Z13228 Encounter for screening for other metabolic disorders: Secondary | ICD-10-CM | POA: Diagnosis not present

## 2024-09-25 DIAGNOSIS — Z1329 Encounter for screening for other suspected endocrine disorder: Secondary | ICD-10-CM | POA: Diagnosis not present

## 2024-09-25 DIAGNOSIS — Z1321 Encounter for screening for nutritional disorder: Secondary | ICD-10-CM | POA: Diagnosis not present

## 2024-09-25 DIAGNOSIS — Z131 Encounter for screening for diabetes mellitus: Secondary | ICD-10-CM | POA: Diagnosis not present

## 2024-09-26 LAB — LAB REPORT - SCANNED
A1c: 5.1
EGFR: 100

## 2024-10-25 ENCOUNTER — Ambulatory Visit: Payer: Self-pay | Admitting: Physician Assistant

## 2024-10-25 ENCOUNTER — Encounter: Payer: Self-pay | Admitting: Physician Assistant

## 2024-10-25 VITALS — BP 110/80 | HR 69 | Temp 97.7°F | Ht 62.0 in | Wt 113.0 lb

## 2024-10-25 DIAGNOSIS — E7800 Pure hypercholesterolemia, unspecified: Secondary | ICD-10-CM | POA: Diagnosis not present

## 2024-10-25 DIAGNOSIS — Z79899 Other long term (current) drug therapy: Secondary | ICD-10-CM

## 2024-10-25 MED ORDER — PRAVASTATIN SODIUM 10 MG PO TABS: 10.0000 mg | ORAL_TABLET | Freq: Every day | ORAL | 1 refills | Status: AC

## 2024-10-25 NOTE — Progress Notes (Signed)
 "  History of Present Illness:   Chief Complaint  Patient presents with   Results    Pt is here for f/u to discuss lab results and CT Cardiac Score Scan.    Discussed the use of AI scribe software for clinical note transcription with the patient, who gave verbal consent to proceed.  History of Present Illness   Christina [name] is a 59 year old female who presents for evaluation of her cholesterol levels and calcium score.  She recently had CT imaging and blood work with LDL 110 mg/dL and a coronary artery calcium score of 0.54, in the 63rd percentile for her age, gender, and race. She is in full menopause and has not had cholesterol checked since an insurance change. She is worried about starting cholesterol medication because of possible liver effects while on methotrexate for rheumatoid arthritis.  She has a family history of heart disease in her mother and siblings with hypertension and on cholesterol medication. She has no personal history of high blood pressure.  She has no chest pain or shortness of breath. 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  Current medications include methotrexate, taken as eight tablets once weekly, and daily hydroxychloroquine. She follows with rheumatology every three to four months.        Past Medical History:  Diagnosis Date   Generalized headaches    Migraine    Situational anxiety    related to daughters death     Social History[1]  Past Surgical History:  Procedure Laterality Date   BREAST EXCISIONAL BIOPSY Right 2019   sclerosing lesion, adenosis, fibrocystic changes   BREAST LUMPECTOMY WITH RADIOACTIVE SEED LOCALIZATION Right 06/08/2018   Procedure: RIGHT BREAST LUMPECTOMY WITH RADIOACTIVE SEED LOCALIZATION;  Surgeon: Vernetta Berg, MD;  Location: [facility];  Service: General;  Laterality: Right;   LAPAROSCOPIC APPENDECTOMY  02/16/2012    Procedure: APPENDECTOMY LAPAROSCOPIC;  Surgeon: Berg DELENA Vernetta, MD;  Location: [facility];  Service: General;  Laterality: N/A;   TONSILLECTOMY     TUBAL LIGATION     WISDOM TOOTH EXTRACTION      Family History  Problem Relation Age of Onset   Heart disease Mother    Stroke Mother    Heart attack Mother    [name] failure Mother    Cancer Father        prostate   Parkinson's disease Father    Colon cancer Neg Hx    Esophageal cancer Neg Hx    Rectal cancer Neg Hx    Stomach cancer Neg Hx     Allergies[2]  Current Medications:  Current Medications[3]   Review of Systems:   Negative unless otherwise specified per HPI.  Vitals:   Vitals:   10/25/24 0829  BP: 110/80  Pulse: 69  Temp: 97.7 F (36.5 C)  TempSrc: Temporal  SpO2: 98%  Weight: 113 lb (51.3 kg)  Height: 5' 2 (1.575 m)     Body mass index is 20.67 kg/m.  Physical Exam:   Physical Exam Vitals and nursing note reviewed.  Constitutional:      General: She is not in acute distress.    Appearance: She is well-developed. She is not ill-appearing or toxic-appearing.  Cardiovascular:     Rate and Rhythm: Normal rate and regular rhythm.     Pulses: Normal pulses.     Heart sounds: Normal heart  sounds, S1 normal and S2 normal.  Pulmonary:     Effort: Pulmonary effort is normal.     Breath sounds: Normal breath sounds.  Skin:    General: Skin is warm and dry.  Neurological:     Mental Status: She is alert.     GCS: GCS eye subscore is 4. GCS verbal subscore is 5. GCS motor subscore is 6.  Psychiatric:        Speech: Speech normal.        Behavior: Behavior normal. Behavior is cooperative.     Assessment and Plan:   Assessment and Plan    Elevated cholesterol; High risk medication use  LDL 110 mg/dL, calcium score 9.45 indicates plaque. Family history of heart disease. Discussed statin therapy risks and benefits, chose pravastatin  due to lower liver risk with methotrexate. - Started pravastatin   10 mg daily. - Fasting labs in 6 weeks for cholesterol and liver function. - Informed rheumatologist about pravastatin  initiation. - Advised to report pravastatin  side effects, including muscle pain.      Lucie Buttner, PA-C    [1]  Social History Tobacco Use   Smoking status: Never   Smokeless tobacco: Never  Vaping Use   Vaping status: Never Used  Substance Use Topics   Alcohol use: No    Alcohol/week: 0.0 standard drinks of alcohol   Drug use: No  [2]  Allergies Allergen Reactions   Amoxicillin-Pot Clavulanate Other (See Comments)    Stomach issues  [3]  Current Outpatient Medications:    acetaminophen  (TYLENOL  8 HOUR ARTHRITIS PAIN) 650 MG CR tablet, 2 tablets as needed, Disp: , Rfl:    Cholecalciferol (VITAMIN D -3) 25 MCG (1000 UT) CAPS, Take 1 capsule by mouth daily., Disp: , Rfl:    Collagen Hydrolysate POWD, 1 Scoop by Does not apply route daily., Disp: , Rfl:    folic acid (FOLVITE) 1 MG tablet, folic acid 1 mg tablet  TAKE 1 TABLET BY MOUTH ONCE DAILY, Disp: , Rfl:    hydroxychloroquine (PLAQUENIL) 200 MG tablet, Take 150 mg by mouth daily., Disp: , Rfl:    Lysine 500 MG TABS, Take 500 mg by mouth daily. , Disp: , Rfl:    methotrexate (RHEUMATREX) 2.5 MG tablet, methotrexate sodium 2.5 mg tablet  TAKE 4 TABLETS BY MOUTH ONCE A WEEK AS DIRECTED, Disp: , Rfl:    Multiple Vitamins-Minerals (MULTIVITAMIN WITH MINERALS) tablet, Take 1 tablet by mouth daily. , Disp: , Rfl:    pravastatin  (PRAVACHOL ) 10 MG tablet, Take 1 tablet (10 mg total) by mouth daily., Disp: 90 tablet, Rfl: 1  "

## 2024-10-25 NOTE — Progress Notes (Signed)
 Office notes faxed to Dr. Curt at [facility]

## 2024-10-25 NOTE — Patient Instructions (Addendum)
 Consider OTC (available over the counter without a prescription) psyllium fiber to help lower your LDL. This can be found in products such a metamucil, but any generic equivalent is fine.  Start pravastatin  10 mg daily If muscle aches, let me know  We will send note over to your rheumatologist  Schedule lab-appointment in 6 weeks so we can obtain fasting lipid and liver panel. After midnight on the day of the lab draw, please do not eat anything. You may have water, black coffee, unsweetened tea.   RECOMMENDATIONS: Coronary artery calcium (CAC) score is a strong predictor of incident coronary heart disease (CHD) and provides predictive information beyond traditional risk factors. CAC scoring is reasonable to use in the decision to withhold, postpone, or initiate statin therapy in intermediate-risk or selected borderline-risk asymptomatic adults (age 23-75 years and LDL-C >=70 to <190 mg/dL) who do not have diabetes or established atherosclerotic cardiovascular disease (ASCVD).* In intermediate-risk (10-year ASCVD risk >=7.5% to <20%) adults or selected borderline-risk (10-year ASCVD risk >=5% to <7.5%) adults in whom a CAC score is measured for the purpose of making a treatment decision the following recommendations have been made:   If CAC is 1 to 99, it is reasonable to initiate statin therapy for patients >=64 years of age.   If CAC is >=100 or >=75th percentile, it is reasonable to initiate statin therapy at any age.   Cardiology referral should be considered for patients with CAC scores >=400 or >=75th percentile.   *2018 AHA/ACC/AACVPR/AAPA/ABC/ACPM/ADA/AGS/APhA/ASPC/NLA/PCNA Guideline on the Management of Blood Cholesterol: A Report of the American College of Cardiology/American Heart Association Task Force on Clinical Practice Guidelines. J Am Coll Cardiol. 2019;73(24):3168-3209.

## 2024-12-06 ENCOUNTER — Other Ambulatory Visit
